# Patient Record
Sex: Female | Born: 1970 | Race: Black or African American | Hispanic: No | Marital: Married | Smoking: Never smoker
Health system: Southern US, Community
[De-identification: ages and names within clinical notes are randomized; demographics above are authoritative.]

## PROBLEM LIST (undated history)

## (undated) DIAGNOSIS — M1990 Unspecified osteoarthritis, unspecified site: Secondary | ICD-10-CM

## (undated) DIAGNOSIS — N920 Excessive and frequent menstruation with regular cycle: Secondary | ICD-10-CM

## (undated) DIAGNOSIS — N946 Dysmenorrhea, unspecified: Secondary | ICD-10-CM

## (undated) DIAGNOSIS — N83209 Unspecified ovarian cyst, unspecified side: Secondary | ICD-10-CM

## (undated) HISTORY — DX: Excessive and frequent menstruation with regular cycle: N92.0

## (undated) HISTORY — DX: Dysmenorrhea, unspecified: N94.6

## (undated) HISTORY — DX: Unspecified ovarian cyst, unspecified side: N83.209

---

## 1997-12-11 ENCOUNTER — Encounter: Admission: RE | Admit: 1997-12-11 | Discharge: 1998-03-11 | Payer: Self-pay | Admitting: Family Medicine

## 1998-12-30 ENCOUNTER — Emergency Department (HOSPITAL_COMMUNITY): Admission: EM | Admit: 1998-12-30 | Discharge: 1998-12-30 | Payer: Self-pay

## 1999-04-25 ENCOUNTER — Emergency Department (HOSPITAL_COMMUNITY): Admission: EM | Admit: 1999-04-25 | Discharge: 1999-04-25 | Payer: Self-pay | Admitting: Emergency Medicine

## 1999-04-28 ENCOUNTER — Emergency Department (HOSPITAL_COMMUNITY): Admission: EM | Admit: 1999-04-28 | Discharge: 1999-04-28 | Payer: Self-pay | Admitting: Emergency Medicine

## 1999-10-15 ENCOUNTER — Emergency Department (HOSPITAL_COMMUNITY): Admission: EM | Admit: 1999-10-15 | Discharge: 1999-10-15 | Payer: Self-pay

## 2000-02-12 ENCOUNTER — Emergency Department (HOSPITAL_COMMUNITY): Admission: EM | Admit: 2000-02-12 | Discharge: 2000-02-12 | Payer: Self-pay

## 2001-04-18 ENCOUNTER — Encounter: Payer: Self-pay | Admitting: Emergency Medicine

## 2001-04-18 ENCOUNTER — Emergency Department (HOSPITAL_COMMUNITY): Admission: EM | Admit: 2001-04-18 | Discharge: 2001-04-18 | Payer: Self-pay | Admitting: Emergency Medicine

## 2002-07-05 ENCOUNTER — Emergency Department (HOSPITAL_COMMUNITY): Admission: EM | Admit: 2002-07-05 | Discharge: 2002-07-05 | Payer: Self-pay | Admitting: Emergency Medicine

## 2002-07-05 ENCOUNTER — Encounter: Payer: Self-pay | Admitting: Emergency Medicine

## 2002-07-06 ENCOUNTER — Emergency Department (HOSPITAL_COMMUNITY): Admission: EM | Admit: 2002-07-06 | Discharge: 2002-07-06 | Payer: Self-pay | Admitting: Emergency Medicine

## 2003-07-04 ENCOUNTER — Emergency Department (HOSPITAL_COMMUNITY): Admission: AD | Admit: 2003-07-04 | Discharge: 2003-07-04 | Payer: Self-pay | Admitting: Family Medicine

## 2004-09-08 ENCOUNTER — Ambulatory Visit: Payer: Self-pay | Admitting: Internal Medicine

## 2004-12-26 ENCOUNTER — Ambulatory Visit: Payer: Self-pay | Admitting: Internal Medicine

## 2005-08-13 ENCOUNTER — Emergency Department (HOSPITAL_COMMUNITY): Admission: EM | Admit: 2005-08-13 | Discharge: 2005-08-13 | Payer: Self-pay | Admitting: Family Medicine

## 2006-04-29 ENCOUNTER — Ambulatory Visit: Payer: Self-pay | Admitting: Internal Medicine

## 2006-04-29 LAB — CONVERTED CEMR LAB
ALT: 19 units/L (ref 0–40)
AST: 15 units/L (ref 0–37)
Albumin: 3.9 g/dL (ref 3.5–5.2)
Alkaline Phosphatase: 56 units/L (ref 39–117)
Anti Nuclear Antibody(ANA): NEGATIVE
BUN: 7 mg/dL (ref 6–23)
Basophils Absolute: 0.1 10*3/uL (ref 0.0–0.1)
Basophils Relative: 0.8 % (ref 0.0–1.0)
Bilirubin Urine: NEGATIVE
Bilirubin, Direct: 0.1 mg/dL (ref 0.0–0.3)
CO2: 27 meq/L (ref 19–32)
Calcium: 9.5 mg/dL (ref 8.4–10.5)
Chloride: 104 meq/L (ref 96–112)
Cholesterol: 184 mg/dL (ref 0–200)
Creatinine, Ser: 0.8 mg/dL (ref 0.4–1.2)
Crystals: NEGATIVE
Eosinophils Absolute: 0.1 10*3/uL (ref 0.0–0.6)
Eosinophils Relative: 1.4 % (ref 0.0–5.0)
GFR calc Af Amer: 106 mL/min
GFR calc non Af Amer: 88 mL/min
Glucose, Bld: 123 mg/dL — ABNORMAL HIGH (ref 70–99)
HCT: 39.6 % (ref 36.0–46.0)
HDL: 28.2 mg/dL — ABNORMAL LOW (ref >39.0)
Hemoglobin, Urine: NEGATIVE
Hemoglobin: 13.7 g/dL (ref 12.0–15.0)
Hgb A1c MFr Bld: 7.6 % — ABNORMAL HIGH (ref 4.6–6.0)
LDL Cholesterol: 124 mg/dL — ABNORMAL HIGH (ref 0–99)
Lymphocytes Relative: 44 % (ref 12.0–46.0)
MCHC: 34.7 g/dL (ref 30.0–36.0)
MCV: 86.6 fL (ref 78.0–100.0)
Monocytes Absolute: 0.5 10*3/uL (ref 0.2–0.7)
Monocytes Relative: 7.3 % (ref 3.0–11.0)
Neutro Abs: 3 10*3/uL (ref 1.4–7.7)
Neutrophils Relative %: 46.5 % (ref 43.0–77.0)
Nitrite: NEGATIVE
Platelets: 272 10*3/uL (ref 150–400)
Potassium: 4.1 meq/L (ref 3.5–5.1)
RBC: 4.57 M/uL (ref 3.87–5.11)
RDW: 13.6 % (ref 11.5–14.6)
Rheumatoid fact SerPl-aCnc: <20 intl units/mL — ABNORMAL LOW (ref 0.0–20.0)
Sed Rate: 30 mm/hr — ABNORMAL HIGH (ref 0–25)
Sodium: 139 meq/L (ref 135–145)
Specific Gravity, Urine: >=1.03 (ref 1.000–1.03)
TSH: 0.79 microintl units/mL (ref 0.35–5.50)
Total Bilirubin: 0.7 mg/dL (ref 0.3–1.2)
Total CHOL/HDL Ratio: 6.5
Total Protein, Urine: NEGATIVE mg/dL
Total Protein: 7.4 g/dL (ref 6.0–8.3)
Triglycerides: 160 mg/dL — ABNORMAL HIGH (ref 0–149)
Urine Glucose: NEGATIVE mg/dL
Urobilinogen, UA: 0.2 (ref 0.0–1.0)
VLDL: 32 mg/dL (ref 0–40)
WBC: 6.6 10*3/uL (ref 4.5–10.5)
pH: 6 (ref 5.0–8.0)

## 2007-03-21 ENCOUNTER — Ambulatory Visit: Payer: Self-pay | Admitting: Internal Medicine

## 2007-03-21 DIAGNOSIS — J309 Allergic rhinitis, unspecified: Secondary | ICD-10-CM | POA: Insufficient documentation

## 2007-03-21 DIAGNOSIS — E785 Hyperlipidemia, unspecified: Secondary | ICD-10-CM | POA: Insufficient documentation

## 2007-03-21 DIAGNOSIS — J45909 Unspecified asthma, uncomplicated: Secondary | ICD-10-CM | POA: Insufficient documentation

## 2007-03-21 DIAGNOSIS — E119 Type 2 diabetes mellitus without complications: Secondary | ICD-10-CM | POA: Insufficient documentation

## 2007-03-21 DIAGNOSIS — E739 Lactose intolerance, unspecified: Secondary | ICD-10-CM | POA: Insufficient documentation

## 2007-03-21 DIAGNOSIS — R350 Frequency of micturition: Secondary | ICD-10-CM | POA: Insufficient documentation

## 2007-06-16 ENCOUNTER — Telehealth (INDEPENDENT_AMBULATORY_CARE_PROVIDER_SITE_OTHER): Payer: Self-pay | Admitting: *Deleted

## 2007-06-17 ENCOUNTER — Ambulatory Visit: Payer: Self-pay | Admitting: Internal Medicine

## 2007-06-21 ENCOUNTER — Encounter: Payer: Self-pay | Admitting: Internal Medicine

## 2011-07-14 ENCOUNTER — Ambulatory Visit (INDEPENDENT_AMBULATORY_CARE_PROVIDER_SITE_OTHER): Payer: BC Managed Care – HMO | Admitting: Obstetrics and Gynecology

## 2011-07-14 ENCOUNTER — Encounter: Payer: Self-pay | Admitting: Obstetrics and Gynecology

## 2011-07-14 VITALS — BP 116/74 | HR 78 | Temp 98.9°F | Wt 183.0 lb

## 2011-07-14 DIAGNOSIS — N750 Cyst of Bartholin's gland: Secondary | ICD-10-CM | POA: Insufficient documentation

## 2011-07-14 DIAGNOSIS — N949 Unspecified condition associated with female genital organs and menstrual cycle: Secondary | ICD-10-CM

## 2011-07-14 DIAGNOSIS — R102 Pelvic and perineal pain: Secondary | ICD-10-CM

## 2011-07-14 DIAGNOSIS — Z975 Presence of (intrauterine) contraceptive device: Secondary | ICD-10-CM

## 2011-07-14 LAB — POCT URINE PREGNANCY: Preg Test, Ur: NEGATIVE

## 2011-07-14 NOTE — Patient Instructions (Signed)
Keep appointment for pelvic ultrasound as scheduled.  If you cannot, please call and reschedule.

## 2011-07-14 NOTE — Progress Notes (Signed)
Patient with Mirena insertion 12/23/10 (pt. didn't come for f/u), c/o vaginal swelling and pelvic pain (random x 2 days a few times a month x 2 months).  Pain is not severe and often noticeable but easily ignored.  Vaginal swelling occurred 1 week ago, without pain and only on right side. Pt. noticed when she was bathing. Denies vaginitis sx, dyspareunia,  change in BMs or uti sx.  Admits to transient lower back pain  PMH: + ovarian cysts  O: Back: no CVA tenderness, Abdomen: soft, non-tender     Pelvic: EGBUS- 2 cm non-tender right Bartholin's Cyst,      vagina-rugous, cervix-string visible, no lesions, uterus- ULNS, irregular, non-tender  UPT: negative  A: Pelvic Pain-random     IUD F/U     Right Bartholin's Cyst  P: pelvic ultrasound r/o pelvic masses     reviewed Bartholin's Cyst     f/u after ultrasound    reviewed causes of pelvic pain

## 2011-12-17 ENCOUNTER — Encounter: Payer: Self-pay | Admitting: Obstetrics and Gynecology

## 2011-12-17 ENCOUNTER — Ambulatory Visit (INDEPENDENT_AMBULATORY_CARE_PROVIDER_SITE_OTHER): Payer: BC Managed Care – HMO | Admitting: Obstetrics and Gynecology

## 2011-12-17 VITALS — BP 120/78 | Temp 98.7°F | Wt 186.0 lb

## 2011-12-17 DIAGNOSIS — B9689 Other specified bacterial agents as the cause of diseases classified elsewhere: Secondary | ICD-10-CM

## 2011-12-17 DIAGNOSIS — Z975 Presence of (intrauterine) contraceptive device: Secondary | ICD-10-CM

## 2011-12-17 DIAGNOSIS — A499 Bacterial infection, unspecified: Secondary | ICD-10-CM

## 2011-12-17 DIAGNOSIS — R82998 Other abnormal findings in urine: Secondary | ICD-10-CM

## 2011-12-17 DIAGNOSIS — N72 Inflammatory disease of cervix uteri: Secondary | ICD-10-CM

## 2011-12-17 DIAGNOSIS — N760 Acute vaginitis: Secondary | ICD-10-CM

## 2011-12-17 DIAGNOSIS — R8281 Pyuria: Secondary | ICD-10-CM

## 2011-12-17 DIAGNOSIS — N949 Unspecified condition associated with female genital organs and menstrual cycle: Secondary | ICD-10-CM

## 2011-12-17 DIAGNOSIS — N898 Other specified noninflammatory disorders of vagina: Secondary | ICD-10-CM

## 2011-12-17 DIAGNOSIS — R102 Pelvic and perineal pain: Secondary | ICD-10-CM

## 2011-12-17 LAB — POCT URINALYSIS DIPSTICK
Protein, UA: NEGATIVE
Urobilinogen, UA: NEGATIVE
pH, UA: 6

## 2011-12-17 LAB — POCT WET PREP (WET MOUNT)
Whiff Test: POSITIVE
pH: 5.5

## 2011-12-17 LAB — POCT URINE PREGNANCY: Preg Test, Ur: NEGATIVE

## 2011-12-17 MED ORDER — HYDROCODONE-ACETAMINOPHEN 5-300 MG PO TABS: 1.0000 | ORAL_TABLET | ORAL | Status: DC

## 2011-12-17 MED ORDER — METRONIDAZOLE 500 MG PO TABS: ORAL_TABLET | ORAL | Status: DC

## 2011-12-17 MED ORDER — FLUCONAZOLE 150 MG PO TABS: 150.0000 mg | ORAL_TABLET | Freq: Once | ORAL | Status: DC

## 2011-12-17 NOTE — Progress Notes (Signed)
41 YO with a Mirena IUD (inserted 12/23/11) and a history of left lower quadrant pelvic pain. Pain is off & on, sharp at times and crampy at others.  May last a hour at a time and can't associate it with anything.  Has taken Ibuprofen 400mg   which will decrease a 9/10 pain to 3/10.  Denies urinary tract/bowel symptoms, dyspareunia (but last few times she has bled for a few days afterwards) nor back pain.  O: Abdomen: soft, non-tender      Pelvic: EGBUS-wnl, vagina-small blood, cervix-string visible with small raw area-inferior medial, uterus-tender, adnexae- tenderness in left adnexae   U/A:  pH-6.0, SG-1.020, leukocytes-1+, blood-2+ UPT-negative  A: Pelvic Pain     Bacterial Vaginosis     Mirena IUD     History of ovarian cyst  P: Metronidazole 500 mg #14 bid x 7 days no refills       Pelvic U/S-rule out pelvic masses      Vicodin 5/500 1 po q 6 hours prn moderate-severe pain #20       Diflucan 150 mg #1 1 po stat 1 refill       RTO-for ultrasound  Mase Dhondt, PA-C

## 2011-12-17 NOTE — Progress Notes (Signed)
Contraception: IUD History of STD:  history of chlamydia History of ovarian cyst: yes:  Long time History of fibroids: no History of endometriosis:no Previous ultrasound:no  Urinary symptoms: none Gastro-intestinal symptoms:  Constipation: yes     Diarrhea: no     Nausea: no     Vomiting: no     Fever: no Vaginal discharge: no vaginal discharge  Pt states abdominal pain is in lower left quandrant.

## 2011-12-21 ENCOUNTER — Encounter: Payer: Self-pay | Admitting: Obstetrics and Gynecology

## 2011-12-21 ENCOUNTER — Ambulatory Visit (INDEPENDENT_AMBULATORY_CARE_PROVIDER_SITE_OTHER): Payer: BC Managed Care – HMO

## 2011-12-21 ENCOUNTER — Ambulatory Visit (INDEPENDENT_AMBULATORY_CARE_PROVIDER_SITE_OTHER): Payer: BC Managed Care – HMO | Admitting: Obstetrics and Gynecology

## 2011-12-21 VITALS — Wt 186.0 lb

## 2011-12-21 DIAGNOSIS — N930 Postcoital and contact bleeding: Secondary | ICD-10-CM

## 2011-12-21 DIAGNOSIS — D219 Benign neoplasm of connective and other soft tissue, unspecified: Secondary | ICD-10-CM

## 2011-12-21 DIAGNOSIS — R102 Pelvic and perineal pain: Secondary | ICD-10-CM

## 2011-12-21 DIAGNOSIS — N949 Unspecified condition associated with female genital organs and menstrual cycle: Secondary | ICD-10-CM

## 2011-12-21 DIAGNOSIS — D259 Leiomyoma of uterus, unspecified: Secondary | ICD-10-CM

## 2011-12-21 NOTE — Progress Notes (Signed)
41 YO seen last week for pelvic pain returns for ultrasound.  O: U/S:uterus-8.46 x 7.75 x 6.67 cm, endometrium-0.261 cm, IUD in correct position within the endometrial cavity pe 3D rendering      ovaries appeared normal with no free fluid in pelvis or adnexal abnormalities.  A: Pelvic pain     Fibroid (<1.5 cm)     S/P Cryotherapy of cervix 22 years ago     H/O Post Coital Bleeding Episodes  P: Reviewed fibroids & Reviewed causes of pelvic pain: urogenital, previous surgery, gastrointestinal and musculoskeletal.     F/U with PCP for non-gyn pelvic pain;  aware laparoscopy is a last resort but the next in evaluation of gyn causes of pelvic pain       Reviewed causes of post-coital bleeding to include: infection, endometrial polyps and cervical abnormalities      Will consult Dr. Pennie Rushing about follow up for post-coital bleeding       RTO-as scheduled or prn

## 2011-12-21 NOTE — Patient Instructions (Signed)
Fibroids You have been diagnosed as having a fibroid. Fibroids are smooth muscle lumps (tumors) which can occur any place in a woman's body. They are usually in the womb (uterus). The most common problem (symptom) of fibroids is bleeding. Over time this may cause low red blood cells (anemia). Other symptoms include feelings of pressure and pain in the pelvis. The diagnosis (learning what is wrong) of fibroids is made by physical exam. Sometimes tests such as an ultrasound are used. This is helpful when fibroids are felt around the ovaries and to look for tumors. TREATMENT   Most fibroids do not need surgical or medical treatment. Sometimes a tissue sample (biopsy) of the lining of the uterus is done to rule out cancer. If there is no cancer and only a small amount of bleeding, the problem can be watched.   Hormonal treatment can improve the problem.   When surgery is needed, it can consist of removing the fibroid. Vaginal birth may not be possible after the removal of fibroids. This depends on where they are and the extent of surgery. When pregnancy occurs with fibroids it is usually normal.   Your caregiver can help decide which treatments are best for you.  HOME CARE INSTRUCTIONS   Do not use aspirin as this may increase bleeding problems.   If your periods (menses) are heavy, record the number of pads or tampons used per month. Bring this information to your caregiver. This can help them determine the best treatment for you.  SEEK IMMEDIATE MEDICAL CARE IF:  You have pelvic pain or cramps not controlled with medications, or experience a sudden increase in pain.   You have an increase of pelvic bleeding between and during menses.   You feel lightheaded or have fainting spells.   You develop worsening belly (abdominal) pain.  Document Released: 03/13/2000 Document Revised: 03/05/2011 Document Reviewed: 11/03/2007 [employer] Patient Information 2012 [employer].    

## 2011-12-25 ENCOUNTER — Telehealth: Payer: Self-pay | Admitting: Obstetrics and Gynecology

## 2011-12-25 NOTE — Telephone Encounter (Signed)
Left message that I had spoken with Dr. Pennie Rushing about her concern at the last visit and that I'd call back to discuss.  Japhet Morgenthaler, PA-C

## 2011-12-28 ENCOUNTER — Other Ambulatory Visit: Payer: BC Managed Care – HMO

## 2011-12-28 ENCOUNTER — Encounter: Payer: BC Managed Care – HMO | Admitting: Obstetrics and Gynecology

## 2011-12-28 ENCOUNTER — Telehealth: Payer: Self-pay | Admitting: Obstetrics and Gynecology

## 2011-12-28 MED ORDER — DOXYCYCLINE HYCLATE 100 MG PO CAPS: 100.0000 mg | ORAL_CAPSULE | Freq: Two times a day (BID) | ORAL | Status: DC

## 2011-12-28 NOTE — Telephone Encounter (Signed)
Return call to patient with post-coital bleeding and IUD to advise that Dr. Pennie Rushing recommended testing for GC/CT and Doxycycline 100 mg bid x 7 days.  Afterward, if symptoms persists, she will need follow up in 6 weeks.  Patient was agreeable.  Doxycycline e-prescribed and patient advised to call to set up an appointment for GC/CT testing.  Commodore Bellew, PA-C

## 2013-11-10 ENCOUNTER — Encounter (HOSPITAL_COMMUNITY): Payer: Self-pay | Admitting: Emergency Medicine

## 2013-11-10 ENCOUNTER — Emergency Department (HOSPITAL_COMMUNITY)
Admission: EM | Admit: 2013-11-10 | Discharge: 2013-11-10 | Disposition: A | Discharge status: Home / Self Care | Payer: BC Managed Care – PPO | Attending: Emergency Medicine | Admitting: Emergency Medicine

## 2013-11-10 DIAGNOSIS — Z8742 Personal history of other diseases of the female genital tract: Secondary | ICD-10-CM | POA: Insufficient documentation

## 2013-11-10 DIAGNOSIS — L500 Allergic urticaria: Secondary | ICD-10-CM | POA: Diagnosis not present

## 2013-11-10 DIAGNOSIS — J45909 Unspecified asthma, uncomplicated: Secondary | ICD-10-CM | POA: Diagnosis not present

## 2013-11-10 DIAGNOSIS — Z79899 Other long term (current) drug therapy: Secondary | ICD-10-CM | POA: Diagnosis not present

## 2013-11-10 DIAGNOSIS — E119 Type 2 diabetes mellitus without complications: Secondary | ICD-10-CM | POA: Diagnosis not present

## 2013-11-10 DIAGNOSIS — Z792 Long term (current) use of antibiotics: Secondary | ICD-10-CM | POA: Insufficient documentation

## 2013-11-10 DIAGNOSIS — R21 Rash and other nonspecific skin eruption: Secondary | ICD-10-CM | POA: Insufficient documentation

## 2013-11-10 DIAGNOSIS — T4995XA Adverse effect of unspecified topical agent, initial encounter: Secondary | ICD-10-CM | POA: Diagnosis not present

## 2013-11-10 DIAGNOSIS — T7840XA Allergy, unspecified, initial encounter: Secondary | ICD-10-CM

## 2013-11-10 MED ORDER — FAMOTIDINE IN NACL 20-0.9 MG/50ML-% IV SOLN
20.0000 mg | Freq: Once | INTRAVENOUS | Status: AC
  Administered 2013-11-10: 20 mg via INTRAVENOUS
  Filled 2013-11-10: qty 50

## 2013-11-10 MED ORDER — DIPHENHYDRAMINE HCL 50 MG/ML IJ SOLN
50.0000 mg | Freq: Once | INTRAMUSCULAR | Status: AC
  Administered 2013-11-10: 50 mg via INTRAVENOUS
  Filled 2013-11-10: qty 1

## 2013-11-10 MED ORDER — PREDNISONE 20 MG PO TABS: ORAL_TABLET | ORAL | Status: DC

## 2013-11-10 MED ORDER — ONDANSETRON HCL 4 MG/2ML IJ SOLN
4.0000 mg | Freq: Once | INTRAMUSCULAR | Status: AC
  Administered 2013-11-10: 4 mg via INTRAVENOUS
  Filled 2013-11-10: qty 2

## 2013-11-10 MED ORDER — METHYLPREDNISOLONE SODIUM SUCC 125 MG IJ SOLR
125.0000 mg | Freq: Once | INTRAMUSCULAR | Status: AC
  Administered 2013-11-10: 125 mg via INTRAVENOUS
  Filled 2013-11-10: qty 2

## 2013-11-10 MED ORDER — IBUPROFEN 400 MG PO TABS
600.0000 mg | ORAL_TABLET | Freq: Once | ORAL | Status: AC
  Administered 2013-11-10: 600 mg via ORAL
  Filled 2013-11-10 (×2): qty 1

## 2013-11-10 NOTE — Discharge Instructions (Signed)
Use Benadryl as directed if you should have further itching. Return here for any problems Allergies Allergies may happen from anything your body is sensitive to. This may be food, medicines, pollens, chemicals, and nearly anything around you in everyday life that produces allergens. An allergen is anything that causes an allergy producing substance. Heredity is often a factor in causing these problems. This means you may have some of the same allergies as your parents. Food allergies happen in all age groups. Food allergies are some of the most severe and life threatening. Some common food allergies are cow's milk, seafood, eggs, nuts, wheat, and soybeans. SYMPTOMS   Swelling around the mouth.  An itchy red rash or hives.  Vomiting or diarrhea.  Difficulty breathing. SEVERE ALLERGIC REACTIONS ARE LIFE-THREATENING. This reaction is called anaphylaxis. It can cause the mouth and throat to swell and cause difficulty with breathing and swallowing. In severe reactions only a trace amount of food (for example, peanut oil in a salad) may cause death within seconds. Seasonal allergies occur in all age groups. These are seasonal because they usually occur during the same season every year. They may be a reaction to molds, grass pollens, or tree pollens. Other causes of problems are house dust mite allergens, pet dander, and mold spores. The symptoms often consist of nasal congestion, a runny itchy nose associated with sneezing, and tearing itchy eyes. There is often an associated itching of the mouth and ears. The problems happen when you come in contact with pollens and other allergens. Allergens are the particles in the air that the body reacts to with an allergic reaction. This causes you to release allergic antibodies. Through a chain of events, these eventually cause you to release histamine into the blood stream. Although it is meant to be protective to the body, it is this release that causes your  discomfort. This is why you were given anti-histamines to feel better. If you are unable to pinpoint the offending allergen, it may be determined by skin or blood testing. Allergies cannot be cured but can be controlled with medicine. Hay fever is a collection of all or some of the seasonal allergy problems. It may often be treated with simple over-the-counter medicine such as diphenhydramine. Take medicine as directed. Do not drink alcohol or drive while taking this medicine. Check with your caregiver or package insert for child dosages. If these medicines are not effective, there are many new medicines your caregiver can prescribe. Stronger medicine such as nasal spray, eye drops, and corticosteroids may be used if the first things you try do not work well. Other treatments such as immunotherapy or desensitizing injections can be used if all else fails. Follow up with your caregiver if problems continue. These seasonal allergies are usually not life threatening. They are generally more of a nuisance that can often be handled using medicine. HOME CARE INSTRUCTIONS   If unsure what causes a reaction, keep a diary of foods eaten and symptoms that follow. Avoid foods that cause reactions.  If hives or rash are present:  Take medicine as directed.  You may use an over-the-counter antihistamine (diphenhydramine) for hives and itching as needed.  Apply cold compresses (cloths) to the skin or take baths in cool water. Avoid hot baths or showers. Heat will make a rash and itching worse.  If you are severely allergic:  Following a treatment for a severe reaction, hospitalization is often required for closer follow-up.  Wear a medic-alert bracelet or necklace stating  the allergy.  You and your family must learn how to give adrenaline or use an anaphylaxis kit.  If you have had a severe reaction, always carry your anaphylaxis kit or EpiPen with you. Use this medicine as directed by your caregiver if a  severe reaction is occurring. Failure to do so could have a fatal outcome. SEEK MEDICAL CARE IF:  You suspect a food allergy. Symptoms generally happen within 30 minutes of eating a food.  Your symptoms have not gone away within 2 days or are getting worse.  You develop new symptoms.  You want to retest yourself or your child with a food or drink you think causes an allergic reaction. Never do this if an anaphylactic reaction to that food or drink has happened before. Only do this under the care of a caregiver. SEEK IMMEDIATE MEDICAL CARE IF:   You have difficulty breathing, are wheezing, or have a tight feeling in your chest or throat.  You have a swollen mouth, or you have hives, swelling, or itching all over your body.  You have had a severe reaction that has responded to your anaphylaxis kit or an EpiPen. These reactions may return when the medicine has worn off. These reactions should be considered life threatening. MAKE SURE YOU:   Understand these instructions.  Will watch your condition.  Will get help right away if you are not doing well or get worse. Document Released: 06/09/2002 Document Revised: 07/11/2012 Document Reviewed: 11/14/2007 [facility] Patient Information 2015 [place], [place]. This information is not intended to replace advice given to you by your health care provider. Make sure you discuss any questions you have with your health care provider.

## 2013-11-10 NOTE — ED Notes (Signed)
Pt reports had some swelling to R eye yesterday and woke up this morning with increased swelling; pt c/o throat swelling; swelling is noted to back of throat; denies difficulty breathing; came from MDs office and received steroid; pt also with rash to face; pt does not have any allergies

## 2013-11-10 NOTE — ED Notes (Signed)
Pt c/o period cramps and nausea.

## 2013-11-10 NOTE — ED Provider Notes (Signed)
CSN: [id number]     Arrival date & time 11/10/13  1218 History   First MD Initiated Contact with Patient 11/10/13 1230     Chief Complaint  Patient presents with  . Allergic Reaction     (Consider location/radiation/quality/duration/timing/severity/associated sxs/prior Treatment) HPI Comments:   Patient here complaining of diffuse body rash or pruritus. Symptoms began at her right eye and progressed. Denies any chemical exposures. Went to her doctor's office and was given steroids and sent here for further evaluation. She did notice some scratchiness to the back of her throat but denies any trouble swallowing. Symptoms have gradually been improving. No prior history of same. Denies any medication use.  Patient is a 43 y.o. female presenting with allergic reaction. The history is provided by the patient.  Allergic Reaction   Past Medical History  Diagnosis Date  . Diabetes mellitus   . Asthma   . Menorrhagia   . Dysmenorrhea   . Ovarian cyst    History reviewed. No pertinent past surgical history. Family History  Problem Relation Age of Onset  . Fibromyalgia Mother   . Hypertension Mother   . Depression Mother   . Cancer Father     prostate  . Hypertension Sister   . Hypertension Maternal Grandmother   . Stroke Maternal Grandmother   . Hypertension Maternal Grandfather   . Diabetes Paternal Grandmother    History  Substance Use Topics  . Smoking status: Never Smoker   . Smokeless tobacco: Never Used  . Alcohol Use: No   OB History   Grav Para Term Preterm Abortions TAB SAB Ect Mult Living   1 1 1       1      Review of Systems  All other systems reviewed and are negative.     Allergies  Levofloxacin  Home Medications   Prior to Admission medications   Medication Sig Start Date End Date Taking? Authorizing Provider  doxycycline (VIBRAMYCIN) 100 MG capsule Take 1 capsule (100 mg total) by mouth 2 (two) times daily. 12/28/11   Elmira Powell, PA-C  fexofenadine  (ALLEGRA) 30 MG tablet Take 30 mg by mouth 2 (two) times daily.    Historical Provider, MD  fluconazole (DIFLUCAN) 150 MG tablet Take 1 tablet (150 mg total) by mouth once. 12/17/11   Earnstine Regal, PA-C  Hydrocodone-Acetaminophen 5-300 MG TABS Take 1 tablet by mouth every 4 (four) hours. 12/17/11   Earnstine Regal, PA-C  Liraglutide (VICTOZA [place]) Inject into the skin.    Historical Provider, MD  metFORMIN (GLUCOPHAGE) 500 MG tablet Take 500 mg by mouth 2 (two) times daily with a meal.    Historical Provider, MD  metroNIDAZOLE (FLAGYL) 500 MG tablet 1 po bid x 7 days 12/17/11   Earnstine Regal, PA-C  Multiple Vitamin (MULTIVITAMIN) tablet Take 1 tablet by mouth daily.    Historical Provider, MD   BP 135/74  Pulse 94  Temp(Src) 98.3 F (36.8 C) (Oral)  Resp 20  Ht 5\' 3"  (1.6 m)  SpO2 100% Physical Exam  Nursing note and vitals reviewed. Constitutional: She is oriented to person, place, and time. She appears well-developed and well-nourished.  Non-toxic appearance. No distress.  HENT:  Head: Normocephalic and atraumatic.  Eyes: Conjunctivae, EOM and lids are normal. Pupils are equal, round, and reactive to light.  Neck: Normal range of motion. Neck supple. No tracheal deviation present. No mass present.  Cardiovascular: Normal rate, regular rhythm and normal heart sounds.  Exam reveals no gallop.  No murmur heard. Pulmonary/Chest: Effort normal and breath sounds normal. No stridor. No respiratory distress. She has no decreased breath sounds. She has no wheezes. She has no rhonchi. She has no rales.  Abdominal: Soft. Normal appearance and bowel sounds are normal. She exhibits no distension. There is no tenderness. There is no rebound and no CVA tenderness.  Musculoskeletal: Normal range of motion. She exhibits no edema and no tenderness.  Neurological: She is alert and oriented to person, place, and time. She has normal strength. No cranial nerve deficit or sensory deficit. GCS eye subscore is 4.  GCS verbal subscore is 5. GCS motor subscore is 6.  Skin: Skin is warm and dry. Rash noted. No abrasion noted. Rash is urticarial.  Psychiatric: She has a normal mood and affect. Her speech is normal and behavior is normal.    ED Course  Procedures (including critical care time) Labs Review Labs Reviewed - No data to display  Imaging Review No results found.   EKG Interpretation None      MDM   Final diagnoses:  None    Patient given medications here for allergic reaction. Has greatly improved. Patient will be given referral to allergist    Leota Jacobsen, MD 11/10/13 [phone]

## 2013-11-13 ENCOUNTER — Emergency Department (HOSPITAL_COMMUNITY)
Admission: EM | Admit: 2013-11-13 | Discharge: 2013-11-13 | Disposition: A | Discharge status: Home / Self Care | Payer: BC Managed Care – PPO | Attending: Emergency Medicine | Admitting: Emergency Medicine

## 2013-11-13 ENCOUNTER — Encounter (HOSPITAL_COMMUNITY): Payer: Self-pay | Admitting: Emergency Medicine

## 2013-11-13 ENCOUNTER — Emergency Department (HOSPITAL_COMMUNITY): Payer: BC Managed Care – PPO

## 2013-11-13 DIAGNOSIS — J029 Acute pharyngitis, unspecified: Secondary | ICD-10-CM | POA: Insufficient documentation

## 2013-11-13 DIAGNOSIS — Z9109 Other allergy status, other than to drugs and biological substances: Secondary | ICD-10-CM

## 2013-11-13 DIAGNOSIS — Z79899 Other long term (current) drug therapy: Secondary | ICD-10-CM | POA: Insufficient documentation

## 2013-11-13 DIAGNOSIS — IMO0002 Reserved for concepts with insufficient information to code with codable children: Secondary | ICD-10-CM | POA: Insufficient documentation

## 2013-11-13 DIAGNOSIS — J309 Allergic rhinitis, unspecified: Secondary | ICD-10-CM | POA: Insufficient documentation

## 2013-11-13 DIAGNOSIS — J45909 Unspecified asthma, uncomplicated: Secondary | ICD-10-CM | POA: Diagnosis not present

## 2013-11-13 DIAGNOSIS — E119 Type 2 diabetes mellitus without complications: Secondary | ICD-10-CM | POA: Insufficient documentation

## 2013-11-13 DIAGNOSIS — Z8742 Personal history of other diseases of the female genital tract: Secondary | ICD-10-CM | POA: Diagnosis not present

## 2013-11-13 MED ORDER — AZELASTINE HCL 0.1 % NA SOLN: 1.0000 | Freq: Two times a day (BID) | NASAL | Status: DC

## 2013-11-13 MED ORDER — OXYMETAZOLINE HCL 0.05 % NA SOLN
1.0000 | Freq: Once | NASAL | Status: AC
  Administered 2013-11-13: 1 via NASAL
  Filled 2013-11-13: qty 15

## 2013-11-13 NOTE — ED Notes (Addendum)
Feels like she has mucus in throat today  Feels like she cannot breath at times  Or take a deep breath t was seen Friday for same  States it is not going away was tpols she might have allergic reaction . Pt handling her secretions well speech is clear pulse ox 100 % ra

## 2013-11-13 NOTE — ED Provider Notes (Signed)
CSN: [id number]     Arrival date & time 11/13/13  1342 History   None    Chief Complaint  Patient presents with  . Sore Throat  . Shortness of Breath     (Consider location/radiation/quality/duration/timing/severity/associated sxs/prior Treatment) HPI  Danielle Chang is a 43 y.o. female with past medical history of diabetes, allergies, and asthma presenting for sore throat, and shortness of breath. Patient was recently evaluated for allergic reaction, and started on steroids was scheduled Benadryl. Patient states her symptoms have been stable however she continues to have some throat irritation, which makes her feel like it is "harder to breathe", as well some associated sore throat. Patient states that she's been able to tolerate liquids, but has some irritation when trying to swallow solids. She had difficulty describing her sensation but states that she feels on odd feeling in her throat when she tries to swallow. Patient has not had any nausea or vomiting. Denies chest pain. States the shortness of breath is more of a "constricted" quality in her throat, rather than a sensation in her chest, or lungs. Patient states she works in a [employer] dust a regular basis. Patient denies any fevers, chills, cough, abdominal pain, chest pain, or other associated symptoms.   Past Medical History  Diagnosis Date  . Diabetes mellitus   . Asthma   . Menorrhagia   . Dysmenorrhea   . Ovarian cyst    History reviewed. No pertinent past surgical history. Family History  Problem Relation Age of Onset  . Fibromyalgia Mother   . Hypertension Mother   . Depression Mother   . Cancer Father     prostate  . Hypertension Sister   . Hypertension Maternal Grandmother   . Stroke Maternal Grandmother   . Hypertension Maternal Grandfather   . Diabetes Paternal Grandmother    History  Substance Use Topics  . Smoking status: Never Smoker   . Smokeless tobacco: Never Used  . Alcohol Use: No    OB History   Grav Para Term Preterm Abortions TAB SAB Ect Mult Living   1 1 1       1      Review of Systems  Constitutional: Negative.   HENT: Positive for sore throat.   Eyes: Negative.   Respiratory: Positive for shortness of breath.   Cardiovascular: Negative.   Gastrointestinal: Negative.   Endocrine: Negative.   Genitourinary: Negative.   Musculoskeletal: Negative.   Skin: Negative.   Allergic/Immunologic: Negative.   Neurological: Negative.   Hematological: Negative.   Psychiatric/Behavioral: Negative.       Allergies  Levofloxacin  Home Medications   Prior to Admission medications   Medication Sig Start Date End Date Taking? Authorizing Provider  Exenatide ER (BYDUREON) 2 MG PEN Inject 2 mg into the skin once a week.   Yes Historical Provider, MD  ibuprofen (ADVIL,MOTRIN) 200 MG tablet Take 400 mg by mouth every 6 (six) hours as needed for headache or moderate pain.   Yes Historical Provider, MD  metFORMIN (GLUCOPHAGE) 500 MG tablet Take 500 mg by mouth daily.    Yes Historical Provider, MD  Multiple Vitamin (MULTIVITAMIN) tablet Take 1 tablet by mouth daily.   Yes Historical Provider, MD  predniSONE (DELTASONE) 20 MG tablet Take 40 mg by mouth daily with breakfast. For 5 days 11/10/13  Yes Historical Provider, MD  azelastine (ASTELIN) 0.1 % nasal spray Place 1 spray into both nostrils 2 (two) times daily. Use in each nostril as directed  11/13/13   Hoyle Sauer, MD   BP 139/76  Pulse 81  Temp(Src) 98.9 F (37.2 C)  Resp 16  SpO2 100% Physical Exam  Nursing note and vitals reviewed. Constitutional: She is oriented to person, place, and time. She appears well-developed and well-nourished. No distress.  HENT:  Head: Normocephalic and atraumatic.  Right Ear: External ear normal.  Left Ear: External ear normal.  Nose: Mucosal edema present.  Mouth/Throat: Uvula is midline. Mucous membranes are not pale, not dry and not cyanotic. Posterior oropharyngeal  erythema present. No oropharyngeal exudate, posterior oropharyngeal edema or tonsillar abscesses.  Eyes: Conjunctivae and EOM are normal. Pupils are equal, round, and reactive to light. Right eye exhibits no discharge. Left eye exhibits no discharge. No scleral icterus.  Neck: Normal range of motion and phonation normal. Neck supple. No JVD present. No tracheal tenderness present. No tracheal deviation present. No thyromegaly present.  Cardiovascular: Normal rate, regular rhythm, normal heart sounds and intact distal pulses.  Exam reveals no gallop and no friction rub.   No murmur heard. Pulmonary/Chest: Effort normal and breath sounds normal. No stridor. No respiratory distress. She has no wheezes. She has no rales. She exhibits no tenderness.  Abdominal: Soft. She exhibits no distension.  Musculoskeletal: Normal range of motion. She exhibits no edema and no tenderness.  Lymphadenopathy:    She has no cervical adenopathy.  Neurological: She is alert and oriented to person, place, and time.  Skin: Skin is warm and dry. No rash noted. She is not diaphoretic. No erythema. No pallor.  Psychiatric: She has a normal mood and affect. Her behavior is normal. Judgment and thought content normal.    ED Course  Fiberoptic laryngoscopy Date/Time: 11/13/2013 9:33 PM Performed by: Hoyle Sauer Authorized by: Hoyle Sauer Consent: Verbal consent obtained. Risks and benefits: risks, benefits and alternatives were discussed Consent given by: patient Patient identity confirmed: verbally with patient Local anesthesia used: yes Local anesthetic: topical anesthetic Anesthetic total: 2 ml Patient tolerance: Patient tolerated the procedure well with no immediate complications.   (including critical care time) Labs Review Labs Reviewed - No data to display  Imaging Review Dg Neck Soft Tissue  11/13/2013   CLINICAL DATA:  Sore throat and shortness of breath for 3 days.  EXAM: NECK SOFT TISSUES - 1+  VIEW  COMPARISON:  None.  FINDINGS: There is no evidence of retropharyngeal soft tissue swelling or epiglottic enlargement. The cervical airway is unremarkable and no radio-opaque foreign body identified. Degenerative disc disease is noted at C4-5 and C5-6.  IMPRESSION: 1. Normal soft tissues of the neck. 2. Cervical degenerative disc disease.   Electronically Signed   By: Kerby Moors M.D.   On: 11/13/2013 19:29     EKG Interpretation None      MDM   Final diagnoses:  Sore throat  Environmental allergies   On exam patient is in no acute distress. Symptoms appear stable. Duration of symptoms has been multiple days. Unlikely patient has significant laryngeal edema, or anaphylaxis, due to her stable respiratory status. Patient has symptoms consistent with allergic rhinitis, and chronic postnasal drip. This is likely exacerbating her current symptoms. Also patient has constant exposure to dust and environmental allergens at her work, which are likely exacerbating her symptoms as well. Patient has mild tenderness to the submandibular tissues, however no redness, or induration. Ludwig angina appears unlikely, however will evaluate neck soft tissues with plain films, for screening. No visible PTA, or posterior pharyngeal swelling. Will assess her larynx  by nasolaryngoscopy, and to look for any obvious obstructive process.  On nasolaryngoscopy, patient has generalized redness but no focal swelling over her pharyngeal mucous membranes, no glottic edema, normal vocal cord mobility, and appearance of aretynoids.   Patient is currently taking oral steroids. She would likely benefit long-term from nasal steroids, and avoidance of environmental allergens. Have also advised on nasal rinses, and other adjuvant therapies. Will prescribe nasal antihistamine spray for additional treatment of her postnasal drip, and allergic symptoms. Low concern for acute obstructive process. Some of patient's symptoms may also be  associated with a swallowing dysfunction issue, however she can tolerate liquids and there is no indication for inpatient workup. Have advised for her to see her PCP in the next 2 days, for additional recommendations and referrals as indicated. Have advised to return to the emergency department for any acute worsening in her symptoms, or associated difficulty breathing.  Patient and family given return precautions for allergic reactions.  Advised to return for worsening symptoms including chest pain, shortness of breath, severe headache, intractable nausea or vomiting, fever, or chills, inability to take medications, or other acute concerns.  Advised to follow up with PCP in 2 days.  Patient and family in agreement with and expressed understanding of follow plan, plan of care, and return precautions.  All questions answered prior to discharge.  Patient was discharged in stable condition with family, ambulating without difficulty.  Patient care was discussed with my attending, Dr. Christy Gentles.     Hoyle Sauer, MD 11/14/13 [phone]

## 2013-11-13 NOTE — ED Notes (Signed)
Pt back from X-ray.  

## 2013-11-13 NOTE — ED Provider Notes (Signed)
Patient seen/examined in the Emergency Department in conjunction with Resident Physician Provider brooten Patient reports sore throat and had recent allergic rxn Exam : awake/alert, no stridor, uvula midline, normal phonation Plan: will order soft tissue neck xray.  Pt stable currently   Sharyon Cable, MD 11/13/13 1907

## 2013-11-15 NOTE — ED Provider Notes (Signed)
I have personally seen and examined the patient.  I have discussed the plan of care with the resident.  I have reviewed the documentation on PMH/FH/Soc. History.  I have reviewed the documentation of the resident and agree.   Sharyon Cable, MD 11/15/13 [phone]

## 2014-01-29 ENCOUNTER — Encounter (HOSPITAL_COMMUNITY): Payer: Self-pay | Admitting: Emergency Medicine

## 2014-06-01 ENCOUNTER — Ambulatory Visit
Admission: RE | Admit: 2014-06-01 | Discharge: 2014-06-01 | Disposition: A | Discharge status: Home / Self Care | Payer: BLUE CROSS/BLUE SHIELD | Source: Ambulatory Visit | Attending: Orthopedic Surgery | Admitting: Orthopedic Surgery

## 2014-06-01 ENCOUNTER — Other Ambulatory Visit: Payer: Self-pay | Admitting: Orthopedic Surgery

## 2014-06-01 DIAGNOSIS — M7542 Impingement syndrome of left shoulder: Secondary | ICD-10-CM

## 2016-07-21 ENCOUNTER — Encounter (HOSPITAL_COMMUNITY): Payer: Self-pay | Admitting: Emergency Medicine

## 2016-07-21 ENCOUNTER — Emergency Department (HOSPITAL_COMMUNITY)
Admission: EM | Admit: 2016-07-21 | Discharge: 2016-07-21 | Disposition: A | Discharge status: Home / Self Care | Payer: BC Managed Care – PPO | Attending: Emergency Medicine | Admitting: Emergency Medicine

## 2016-07-21 DIAGNOSIS — Z7984 Long term (current) use of oral hypoglycemic drugs: Secondary | ICD-10-CM | POA: Diagnosis not present

## 2016-07-21 DIAGNOSIS — E119 Type 2 diabetes mellitus without complications: Secondary | ICD-10-CM | POA: Insufficient documentation

## 2016-07-21 DIAGNOSIS — Z79899 Other long term (current) drug therapy: Secondary | ICD-10-CM | POA: Diagnosis not present

## 2016-07-21 DIAGNOSIS — M25512 Pain in left shoulder: Secondary | ICD-10-CM

## 2016-07-21 DIAGNOSIS — Y939 Activity, unspecified: Secondary | ICD-10-CM | POA: Diagnosis not present

## 2016-07-21 DIAGNOSIS — R202 Paresthesia of skin: Secondary | ICD-10-CM | POA: Diagnosis not present

## 2016-07-21 DIAGNOSIS — J45909 Unspecified asthma, uncomplicated: Secondary | ICD-10-CM | POA: Insufficient documentation

## 2016-07-21 DIAGNOSIS — Y92154 Driveway of reform school as the place of occurrence of the external cause: Secondary | ICD-10-CM | POA: Insufficient documentation

## 2016-07-21 DIAGNOSIS — M79602 Pain in left arm: Secondary | ICD-10-CM | POA: Insufficient documentation

## 2016-07-21 DIAGNOSIS — Y999 Unspecified external cause status: Secondary | ICD-10-CM | POA: Diagnosis not present

## 2016-07-21 DIAGNOSIS — S4992XA Unspecified injury of left shoulder and upper arm, initial encounter: Secondary | ICD-10-CM | POA: Diagnosis present

## 2016-07-21 HISTORY — DX: Unspecified osteoarthritis, unspecified site: M19.90

## 2016-07-21 MED ORDER — CYCLOBENZAPRINE HCL 10 MG PO TABS: 10.0000 mg | ORAL_TABLET | Freq: Two times a day (BID) | ORAL | 0 refills | Status: DC | PRN
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
## 2016-07-21 NOTE — Discharge Instructions (Addendum)
Please use ibuprofen or naproxen as needed for muscle pain. Please use warm compresses the area, massaging, stretching. Please follow-up with your primary care provider in 1-2 weeks as needed.  Get help right away if: You have: Numbness, tingling, or weakness in your arms or legs. Severe neck pain, especially tenderness in the middle of the back of your neck. Changes in bowel or bladder control. Increasing pain in any area of your body. Shortness of breath or light-headedness. Chest pain. Blood in your urine, stool, or vomit. Severe pain in your abdomen or your back. Severe or worsening headaches. Sudden vision loss or double vision. Your eye suddenly becomes red. Your pupil is an odd shape or size.

## 2016-07-28 ENCOUNTER — Ambulatory Visit
Admission: RE | Admit: 2016-07-28 | Discharge: 2016-07-28 | Disposition: A | Discharge status: Home / Self Care | Payer: BC Managed Care – PPO | Source: Ambulatory Visit | Attending: Family Medicine | Admitting: Family Medicine

## 2016-07-28 ENCOUNTER — Other Ambulatory Visit: Payer: Self-pay | Admitting: Family Medicine

## 2016-07-28 DIAGNOSIS — M545 Low back pain: Secondary | ICD-10-CM

## 2016-07-28 DIAGNOSIS — M25512 Pain in left shoulder: Secondary | ICD-10-CM

## 2017-05-26 ENCOUNTER — Emergency Department (HOSPITAL_COMMUNITY): Payer: BC Managed Care – PPO

## 2017-05-26 ENCOUNTER — Emergency Department (HOSPITAL_COMMUNITY)
Admission: EM | Admit: 2017-05-26 | Discharge: 2017-05-26 | Disposition: A | Discharge status: Home / Self Care | Payer: BC Managed Care – PPO | Attending: Emergency Medicine | Admitting: Emergency Medicine

## 2017-05-26 ENCOUNTER — Encounter (HOSPITAL_COMMUNITY): Payer: Self-pay | Admitting: Emergency Medicine

## 2017-05-26 DIAGNOSIS — M546 Pain in thoracic spine: Secondary | ICD-10-CM | POA: Diagnosis not present

## 2017-05-26 DIAGNOSIS — E119 Type 2 diabetes mellitus without complications: Secondary | ICD-10-CM | POA: Diagnosis not present

## 2017-05-26 DIAGNOSIS — R0602 Shortness of breath: Secondary | ICD-10-CM | POA: Diagnosis present

## 2017-05-26 DIAGNOSIS — M940 Chondrocostal junction syndrome [Tietze]: Secondary | ICD-10-CM | POA: Diagnosis not present

## 2017-05-26 DIAGNOSIS — Z7984 Long term (current) use of oral hypoglycemic drugs: Secondary | ICD-10-CM | POA: Insufficient documentation

## 2017-05-26 LAB — CBC WITH DIFFERENTIAL/PLATELET
BASOS PCT: 0 %
Basophils Absolute: 0 10*3/uL (ref 0.0–0.1)
Eosinophils Absolute: 0.1 10*3/uL (ref 0.0–0.7)
Eosinophils Relative: 1 %
HCT: 36.7 % (ref 36.0–46.0)
Hemoglobin: 11.9 g/dL — ABNORMAL LOW (ref 12.0–15.0)
LYMPHS ABS: 3.1 10*3/uL (ref 0.7–4.0)
Lymphocytes Relative: 38 %
MCH: 29.2 pg (ref 26.0–34.0)
MCHC: 32.4 g/dL (ref 30.0–36.0)
MCV: 90.2 fL (ref 78.0–100.0)
MONO ABS: 0.8 10*3/uL (ref 0.1–1.0)
MONOS PCT: 9 %
Neutro Abs: 4.2 10*3/uL (ref 1.7–7.7)
Neutrophils Relative %: 52 %
PLATELETS: 362 10*3/uL (ref 150–400)
RBC: 4.07 MIL/uL (ref 3.87–5.11)
RDW: 14.9 % (ref 11.5–15.5)
WBC: 8.2 10*3/uL (ref 4.0–10.5)

## 2017-05-26 LAB — BASIC METABOLIC PANEL
Anion gap: 8 (ref 5–15)
BUN: 12 mg/dL (ref 6–20)
CALCIUM: 9.3 mg/dL (ref 8.9–10.3)
CHLORIDE: 107 mmol/L (ref 101–111)
CO2: 25 mmol/L (ref 22–32)
CREATININE: 0.75 mg/dL (ref 0.44–1.00)
GFR calc Af Amer: >60 mL/min (ref >60)
Glucose, Bld: 138 mg/dL — ABNORMAL HIGH (ref 65–99)
Potassium: 3.6 mmol/L (ref 3.5–5.1)
SODIUM: 140 mmol/L (ref 135–145)

## 2017-05-26 LAB — I-STAT BETA HCG BLOOD, ED (MC, WL, AP ONLY): I-stat hCG, quantitative: <5 m[IU]/mL (ref <5)

## 2017-05-26 LAB — D-DIMER, QUANTITATIVE (NOT AT ARMC): D DIMER QUANT: 0.42 ug{FEU}/mL (ref 0.00–0.50)

## 2017-05-26 MED ORDER — CYCLOBENZAPRINE HCL 10 MG PO TABS: 10.0000 mg | ORAL_TABLET | Freq: Two times a day (BID) | ORAL | 0 refills | Status: DC | PRN

## 2017-05-26 MED ORDER — ALBUTEROL SULFATE (2.5 MG/3ML) 0.083% IN NEBU
5.0000 mg | INHALATION_SOLUTION | Freq: Once | RESPIRATORY_TRACT | Status: AC
  Administered 2017-05-26: 5 mg via RESPIRATORY_TRACT
  Filled 2017-05-26: qty 6

## 2017-05-26 MED ORDER — KETOROLAC TROMETHAMINE 30 MG/ML IJ SOLN
30.0000 mg | Freq: Once | INTRAMUSCULAR | Status: AC
  Administered 2017-05-26: 30 mg via INTRAVENOUS
  Filled 2017-05-26: qty 1

## 2017-05-26 NOTE — ED Notes (Signed)
No respiratory or acute distress noted alert and oriented x 3 call light in reach visitor at bedside able to speak in full sentences.

## 2017-05-26 NOTE — ED Notes (Signed)
Patient aware we need a urine sample. Patient unable to void at this time.

## 2017-05-26 NOTE — Discharge Instructions (Signed)
Continue taking your anti inflammatory medication for your chest and back pain.  Take flexeril as needed for muscle spasm.  Follow up with your doctor for further care.  Return if you develop fever, worsening shortness of breath, coughing up blood or if you have other concerns.

## 2017-05-26 NOTE — ED Provider Notes (Signed)
[facility] DEPT Provider Note   CSN: [id number] Arrival date & time: 05/26/17  1641     History   Chief Complaint Chief Complaint  Patient presents with  . Shortness of Breath  . Back Pain    HPI Danielle Chang is a 47 y.o. female.  HPI   47 year old female with history of asthma, diabetes, hyperlipidemia presenting for evaluation of shortness of breath.  Patient report for the past 2 days she has had pain to her upper back as well as her right side of chest.  Pain is waxing waning described as a sharp stabbing sensation, sometimes worse with movement sometimes worse with breathing.  This morning she was wheezing.  Also endorsed nausea.  She has been taking an anti-inflammatory medication Zorvolex which did help pain temporarily.  She denies any fever, chills, lightheadedness, dizziness, runny nose, sneezing, coughing, abdominal pain, dysuria or rash.  No prior history of PE or DVT, no recent surgery, prolonged bed rest, your leg swelling or calf pain.  She is on the Mirena.   Past Medical History:  Diagnosis Date  . Arthritis   . Asthma   . Diabetes mellitus   . Dysmenorrhea   . Menorrhagia   . Ovarian cyst     Patient Active Problem List   Diagnosis Date Noted  . Bartholin's gland cyst 07/14/2011  . DIABETES MELLITUS, TYPE II 03/21/2007  . GLUCOSE INTOLERANCE 03/21/2007  . HYPERLIPIDEMIA 03/21/2007  . ALLERGIC RHINITIS 03/21/2007  . ASTHMA 03/21/2007  . FREQUENCY, URINARY 03/21/2007    History reviewed. No pertinent surgical history.  OB History    Gravida Para Term Preterm AB Living   1 1 1     1    SAB TAB Ectopic Multiple Live Births                   Home Medications    Prior to Admission medications   Medication Sig Start Date End Date Taking? Authorizing Provider  azelastine (ASTELIN) 0.1 % nasal spray Place 1 spray into both nostrils 2 (two) times daily. Use in each nostril as directed 11/13/13   Hoyle Sauer, MD    cyclobenzaprine (FLEXERIL) 10 MG tablet Take 1 tablet (10 mg total) by mouth 2 (two) times daily as needed for muscle spasms. 07/21/16   Bettey Costa, PA  Exenatide ER (BYDUREON) 2 MG PEN Inject 2 mg into the skin once a week.    [provider]  ibuprofen (ADVIL,MOTRIN) 200 MG tablet Take 400 mg by mouth every 6 (six) hours as needed for headache or moderate pain.    [provider]  metFORMIN (GLUCOPHAGE) 500 MG tablet Take 500 mg by mouth daily.     [provider]  Multiple Vitamin (MULTIVITAMIN) tablet Take 1 tablet by mouth daily.    [provider]  predniSONE (DELTASONE) 20 MG tablet Take 40 mg by mouth daily with breakfast. For 5 days 11/10/13   [provider]    Family History Family History  Problem Relation Age of Onset  . Fibromyalgia Mother   . Hypertension Mother   . Depression Mother   . Cancer Father        prostate  . Hypertension Sister   . Hypertension Maternal Grandmother   . Stroke Maternal Grandmother   . Hypertension Maternal Grandfather   . Diabetes Paternal Grandmother     Social History Social History   Tobacco Use  . Smoking status: Never Smoker  .  Smokeless tobacco: Never Used  Substance Use Topics  . Alcohol use: No  . Drug use: No     Allergies   Levofloxacin   Review of Systems Review of Systems  All other systems reviewed and are negative.    Physical Exam Updated Vital Signs BP (!) 147/83 (BP Location: Right Arm)   Pulse 95   Temp 98.5 F (36.9 C) (Oral)   Resp 20   SpO2 100%   Physical Exam  Constitutional: She appears well-developed and well-nourished. No distress.  Obese female sitting in the in no acute discomfort.  HENT:  Head: Atraumatic.  Eyes: Conjunctivae are normal.  Neck: Neck supple. No JVD present.  Cardiovascular: Normal rate and regular rhythm.  Pulmonary/Chest: Effort normal and breath sounds normal. No respiratory distress. She has no wheezes. She  has no rhonchi. She has no rales. She exhibits tenderness (Tenderness to upper anterior chest wall without any crepitus or emphysema).  Abdominal: Soft. There is no tenderness.  Musculoskeletal:       Right lower leg: She exhibits no edema.       Left lower leg: She exhibits no edema.  Tenderness to midline thoracic spine on palpation without crepitus or step-off.  Neurological: She is alert.  Skin: Capillary refill takes less than 2 seconds. No rash noted.  Psychiatric: She has a normal mood and affect.  Nursing note and vitals reviewed.    ED Treatments / Results  Labs (all labs ordered are listed, but only abnormal results are displayed) Labs Reviewed  CBC WITH DIFFERENTIAL/PLATELET - Abnormal; Notable for the following components:      Result Value   Hemoglobin 11.9 (*)    All other components within normal limits  BASIC METABOLIC PANEL - Abnormal; Notable for the following components:   Glucose, Bld 138 (*)    All other components within normal limits  D-DIMER, QUANTITATIVE (NOT AT [facility])  I-STAT BETA HCG BLOOD, ED ([facility], [facility], [facility] ONLY)  POC URINE PREG, ED    EKG  EKG Interpretation  Date/Time:  Wednesday May 26 2017 16:52:48 EST Ventricular Rate:  99 PR Interval:    QRS Duration: 73 QT Interval:  337 QTC Calculation: 433 R Axis:   58 Text Interpretation:  Sinus rhythm Probable left atrial enlargement Low voltage, precordial leads Baseline wander in lead(s) V6 Confirmed by Nat Christen [phone]) on 05/26/2017 6:37:21 PM     ED ECG REPORT   Date: 05/26/2017  Rate: 99  Rhythm: normal sinus rhythm  QRS Axis: normal  Intervals: normal  ST/T Wave abnormalities: normal  Conduction Disutrbances:none  Narrative Interpretation:   Old EKG Reviewed: none available  I have personally reviewed the EKG tracing and agree with the computerized printout as noted.   Radiology Dg Chest 2 View  Result Date: 05/26/2017 CLINICAL DATA:  Chest pain and shortness of breath EXAM:  CHEST  2 VIEW COMPARISON:  None. FINDINGS: Lungs are clear. Heart size and pulmonary vascularity are normal. No adenopathy. No bone lesions. No pneumothorax IMPRESSION: No edema or consolidation. Electronically Signed   By: Lowella Grip III M.D.   On: 05/26/2017 17:39    Procedures Procedures (including critical care time)  Medications Ordered in ED Medications  albuterol (PROVENTIL) (2.5 MG/3ML) 0.083% nebulizer solution 5 mg (5 mg Nebulization Given 05/26/17 1656)  ketorolac (TORADOL) 30 MG/ML injection 30 mg (30 mg Intravenous Given 05/26/17 1759)     Initial Impression / Assessment and Plan / ED Course  I have reviewed the triage  vital signs and the nursing notes.  Pertinent labs & imaging results that were available during my care of the patient were reviewed by me and considered in my medical decision making (see chart for details).     BP 125/76   Pulse 95   Temp 98.5 F (36.9 C) (Oral)   Resp 20   SpO2 99%    Final Clinical Impressions(s) / ED Diagnoses   Final diagnoses:  Costochondritis, acute    ED Discharge Orders        Ordered    cyclobenzaprine (FLEXERIL) 10 MG tablet  2 times daily PRN     05/26/17 2029     5:55 PM Patient complaining of pleuritic chest pain as well as reproducible chest wall pain.  She reportedly was having some wheezing earlier improved with breathing treatment while in the ER.  No active wheezing currently.  Chest x-ray without any concerning findings.  Her initial EKG is without concerning arrhythmia or ischemic changes.  Due to the pleuritic nature of her pain, will obtain a d-dimer along with further workup.  Toradol given for pain.  8:25 PM Fortunately her d-dimer is negative therefore low suspicion for PE. Doubt dissection. Her pregnancy test is negative, labs are reassuring, EKG and chest x-ray unremarkable.  Patient is currently resting comfortably.  Current treatment did help.  I encourage patient to continue taking her  anti-inflammatory medication at home, she may follow-up with her primary care provider for further care.  Return precautions discussed.   Domenic Moras, PA-C 05/26/17 2032    Nat Christen, MD 05/28/17 2034

## 2017-05-26 NOTE — ED Triage Notes (Signed)
Patient presents ambulatory with labored breathing stating right sided chest pain, back pain and difficulty breathing onset of yesterday. Wheezing noted in triage. Breathing treatment started.

## 2019-01-12 ENCOUNTER — Ambulatory Visit
Admission: RE | Admit: 2019-01-12 | Discharge: 2019-01-12 | Disposition: A | Discharge status: Home / Self Care | Payer: BC Managed Care – PPO | Source: Ambulatory Visit | Attending: Family Medicine | Admitting: Family Medicine

## 2019-01-12 ENCOUNTER — Other Ambulatory Visit: Payer: Self-pay | Admitting: Family Medicine

## 2019-01-12 DIAGNOSIS — R52 Pain, unspecified: Secondary | ICD-10-CM

## 2019-03-03 ENCOUNTER — Other Ambulatory Visit: Payer: Self-pay

## 2019-03-03 DIAGNOSIS — Z20822 Contact with and (suspected) exposure to covid-19: Secondary | ICD-10-CM

## 2019-03-05 LAB — NOVEL CORONAVIRUS, NAA: SARS-CoV-2, NAA: NOT DETECTED

## 2019-04-07 ENCOUNTER — Ambulatory Visit: Payer: BC Managed Care – PPO | Attending: Internal Medicine

## 2019-04-07 DIAGNOSIS — Z20822 Contact with and (suspected) exposure to covid-19: Secondary | ICD-10-CM

## 2019-04-09 LAB — NOVEL CORONAVIRUS, NAA: SARS-CoV-2, NAA: NOT DETECTED

## 2020-08-21 ENCOUNTER — Ambulatory Visit
Admission: RE | Admit: 2020-08-21 | Discharge: 2020-08-21 | Disposition: A | Discharge status: Home / Self Care | Payer: BC Managed Care – PPO | Source: Ambulatory Visit | Attending: Family Medicine | Admitting: Family Medicine

## 2020-08-21 ENCOUNTER — Other Ambulatory Visit: Payer: Self-pay | Admitting: Family Medicine

## 2020-08-21 DIAGNOSIS — R52 Pain, unspecified: Secondary | ICD-10-CM

## 2020-09-18 ENCOUNTER — Other Ambulatory Visit: Payer: Self-pay | Admitting: Endocrinology

## 2020-09-18 ENCOUNTER — Other Ambulatory Visit (HOSPITAL_COMMUNITY): Payer: Self-pay | Admitting: Endocrinology

## 2020-09-18 DIAGNOSIS — E0590 Thyrotoxicosis, unspecified without thyrotoxic crisis or storm: Secondary | ICD-10-CM

## 2020-09-20 ENCOUNTER — Ambulatory Visit: Payer: BC Managed Care – PPO | Admitting: Allergy

## 2020-10-07 ENCOUNTER — Other Ambulatory Visit (HOSPITAL_COMMUNITY): Payer: BC Managed Care – PPO

## 2020-10-07 ENCOUNTER — Ambulatory Visit (HOSPITAL_COMMUNITY): Payer: BC Managed Care – PPO

## 2020-10-08 ENCOUNTER — Other Ambulatory Visit (HOSPITAL_COMMUNITY): Payer: BC Managed Care – PPO

## 2020-10-19 ENCOUNTER — Other Ambulatory Visit: Payer: Self-pay | Admitting: Rehabilitation

## 2020-10-19 DIAGNOSIS — M5412 Radiculopathy, cervical region: Secondary | ICD-10-CM

## 2020-10-23 ENCOUNTER — Ambulatory Visit (HOSPITAL_COMMUNITY)
Admission: RE | Admit: 2020-10-23 | Discharge: 2020-10-23 | Disposition: A | Discharge status: Home / Self Care | Payer: BC Managed Care – PPO | Source: Ambulatory Visit | Attending: Endocrinology | Admitting: Endocrinology

## 2020-10-23 ENCOUNTER — Other Ambulatory Visit: Payer: Self-pay

## 2020-10-23 ENCOUNTER — Encounter (HOSPITAL_COMMUNITY)
Admission: RE | Admit: 2020-10-23 | Discharge: 2020-10-23 | Disposition: A | Discharge status: Home / Self Care | Payer: BC Managed Care – PPO | Source: Ambulatory Visit | Attending: Endocrinology | Admitting: Endocrinology

## 2020-10-23 DIAGNOSIS — E0590 Thyrotoxicosis, unspecified without thyrotoxic crisis or storm: Secondary | ICD-10-CM | POA: Diagnosis present

## 2020-10-23 MED ORDER — SODIUM IODIDE I-123 7.4 MBQ CAPS
446.7000 | ORAL_CAPSULE | Freq: Once | ORAL | Status: AC
  Administered 2020-10-23: 446.7 via ORAL

## 2020-10-24 ENCOUNTER — Ambulatory Visit (HOSPITAL_COMMUNITY)
Admission: RE | Admit: 2020-10-24 | Discharge: 2020-10-24 | Disposition: A | Discharge status: Home / Self Care | Payer: BC Managed Care – PPO | Source: Ambulatory Visit | Attending: Endocrinology | Admitting: Endocrinology

## 2020-10-28 ENCOUNTER — Ambulatory Visit
Admission: RE | Admit: 2020-10-28 | Discharge: 2020-10-28 | Disposition: A | Discharge status: Home / Self Care | Payer: BC Managed Care – PPO | Source: Ambulatory Visit | Attending: Rehabilitation | Admitting: Rehabilitation

## 2020-10-28 ENCOUNTER — Other Ambulatory Visit: Payer: Self-pay

## 2020-10-28 DIAGNOSIS — M5412 Radiculopathy, cervical region: Secondary | ICD-10-CM

## 2020-10-31 ENCOUNTER — Other Ambulatory Visit: Payer: Self-pay | Admitting: Endocrinology

## 2020-10-31 DIAGNOSIS — E0590 Thyrotoxicosis, unspecified without thyrotoxic crisis or storm: Secondary | ICD-10-CM

## 2020-11-11 ENCOUNTER — Ambulatory Visit
Admission: RE | Admit: 2020-11-11 | Discharge: 2020-11-11 | Disposition: A | Discharge status: Home / Self Care | Payer: BC Managed Care – PPO | Source: Ambulatory Visit | Attending: Endocrinology | Admitting: Endocrinology

## 2020-11-11 DIAGNOSIS — E0590 Thyrotoxicosis, unspecified without thyrotoxic crisis or storm: Secondary | ICD-10-CM

## 2020-12-03 ENCOUNTER — Encounter: Payer: Self-pay | Admitting: Neurology

## 2020-12-09 ENCOUNTER — Ambulatory Visit: Payer: BC Managed Care – PPO | Admitting: Allergy

## 2020-12-30 ENCOUNTER — Other Ambulatory Visit: Payer: Self-pay

## 2020-12-30 DIAGNOSIS — G629 Polyneuropathy, unspecified: Secondary | ICD-10-CM

## 2020-12-31 ENCOUNTER — Other Ambulatory Visit: Payer: Self-pay

## 2020-12-31 ENCOUNTER — Ambulatory Visit (INDEPENDENT_AMBULATORY_CARE_PROVIDER_SITE_OTHER): Payer: BC Managed Care – PPO | Admitting: Neurology

## 2020-12-31 DIAGNOSIS — G629 Polyneuropathy, unspecified: Secondary | ICD-10-CM | POA: Diagnosis not present

## 2020-12-31 DIAGNOSIS — G5622 Lesion of ulnar nerve, left upper limb: Secondary | ICD-10-CM

## 2020-12-31 DIAGNOSIS — G5602 Carpal tunnel syndrome, left upper limb: Secondary | ICD-10-CM

## 2020-12-31 NOTE — Procedures (Signed)
[facility]  [place], [address]  [place], [place] [address] Tel: [phone] Fax:  [phone] Test Date:  12/31/2020  Patient: Danielle Chang DOB: 20-Feb-1971 Physician: Narda Amber, DO  Sex: Female Height: 5\' 2"  Ref Phys: Beau Fanny, PA  ID#: [id number]   Technician:    Patient Complaints: This is a 50 year old female referred for evaluation of left arm paresthesias.  NCV & EMG Findings: Extensive electrodiagnostic testing of the left upper extremity shows:  Left median sensory response shows prolonged latency (4.0 ms).  Left ulnar sensory response is within normal limits. Left median motor responses within normal limits.  Left ulnar motor response shows slowed conduction velocity across the elbow (A Elbow-B Elbow, 42 m/s).   There is no evidence of active or chronic motor axonal loss changes affecting any of the tested muscles.  Motor unit configuration and recruitment pattern is within normal limits.    Impression: Left median neuropathy at or distal to the wrist, consistent with a clinical diagnosis of carpal tunnel syndrome.  Overall, these findings are mild in degree electrically Left ulnar neuropathy with slowing across the elbow, mild. There is no evidence of a left cervical radiculopathy.   ___________________________ Narda Amber, DO    Nerve Conduction Studies Anti Sensory Summary Table   Stim Site NR Peak (ms) Norm Peak (ms) P-T Amp (V) Norm P-T Amp  Left Median Anti Sensory (2nd Digit)  33C  Wrist    4.0 <3.4 42.2 >20  Left Ulnar Anti Sensory (5th Digit)  33C  Wrist    2.7 <3.1 75.3 >12   Motor Summary Table   Stim Site NR Onset (ms) Norm Onset (ms) O-P Amp (mV) Norm O-P Amp Site1 Site2 Delta-0 (ms) Dist (cm) Vel (m/s) Norm Vel (m/s)  Left Median Motor (Abd Poll Brev)  33C  Wrist    3.8 <3.9 12.6 >6 Elbow Wrist 5.0 28.0 56 >50  Elbow    8.8  11.7         Left Ulnar Motor (Abd Dig Minimi)  33C  Wrist    2.3 <3.1 11.9 >7 B  Elbow Wrist 3.0 20.0 67 >50  B Elbow    5.3  11.0  A Elbow B Elbow 2.4 10.0 42 >50  A Elbow    7.7  10.6          EMG   Side Muscle Ins Act Fibs Psw Fasc Number Recrt Dur Dur. Amp Amp. Poly Poly. Comment  Left 1stDorInt Nml Nml Nml Nml Nml Nml Nml Nml Nml Nml Nml Nml N/A  Left Abd Poll Brev Nml Nml Nml Nml Nml Nml Nml Nml Nml Nml Nml Nml N/A  Left PronatorTeres Nml Nml Nml Nml Nml Nml Nml Nml Nml Nml Nml Nml N/A  Left Biceps Nml Nml Nml Nml Nml Nml Nml Nml Nml Nml Nml Nml N/A  Left Triceps Nml Nml Nml Nml Nml Nml Nml Nml Nml Nml Nml Nml N/A  Left Deltoid Nml Nml Nml Nml Nml Nml Nml Nml Nml Nml Nml Nml N/A  Left ABD Dig Min Nml Nml Nml Nml Nml Nml Nml Nml Nml Nml Nml Nml N/A  Left FlexCarpiUln Nml Nml Nml Nml Nml Nml Nml Nml Nml Nml Nml Nml N/A      Waveforms:

## 2021-02-04 ENCOUNTER — Ambulatory Visit: Payer: BC Managed Care – PPO | Admitting: Allergy & Immunology

## 2021-04-01 ENCOUNTER — Ambulatory Visit: Payer: BC Managed Care – PPO | Admitting: Allergy & Immunology

## 2021-04-01 ENCOUNTER — Encounter: Payer: Self-pay | Admitting: Allergy & Immunology

## 2021-04-01 ENCOUNTER — Other Ambulatory Visit: Payer: Self-pay

## 2021-04-01 VITALS — BP 118/70 | HR 98 | Temp 98.5°F | Resp 17 | Ht 63.0 in | Wt 174.0 lb

## 2021-04-01 DIAGNOSIS — T783XXD Angioneurotic edema, subsequent encounter: Secondary | ICD-10-CM

## 2021-04-01 DIAGNOSIS — J302 Other seasonal allergic rhinitis: Secondary | ICD-10-CM

## 2021-04-01 DIAGNOSIS — Z8709 Personal history of other diseases of the respiratory system: Secondary | ICD-10-CM | POA: Diagnosis not present

## 2021-04-01 DIAGNOSIS — J3089 Other allergic rhinitis: Secondary | ICD-10-CM

## 2021-04-01 DIAGNOSIS — J310 Chronic rhinitis: Secondary | ICD-10-CM

## 2021-04-01 DIAGNOSIS — T783XXA Angioneurotic edema, initial encounter: Secondary | ICD-10-CM

## 2021-04-01 NOTE — Progress Notes (Signed)
NEW PATIENT  Date of Service/Encounter:  04/01/21  Consult requested by: Lucianne Lei, MD   Assessment:   Angioedema - unknown trigger  Seasonal and perennial allergic rhinitis (trees, indoor molds, and outdoor molds)  History of asthma  Plan/Recommendations:   1. Angioedema - Testing was positive only to trees as well as indoor and outdoor molds. - Copy of testing results provided. - Avoidance measures provided. - I am unsure if these are relevant, but we will see. - We are going to get some lab work to see if there is something more serious going on (autoimmune issues, serum tryptase, and stinging insect panel). - We will call you in 1-2 weeks with the results of the testing. - Anaphylaxis management plan provided. - EpiPen training and script provided.  - Come see is next time that this happens.   2. Chronic rhinitis - Testing today showed: trees, indoor molds, and outdoor molds. - Copy of test results provided.  - Avoidance measures provided. - Start taking: Allegra OR Claritin (alternate every 1-2 months) - You can use an extra dose of the antihistamine, if needed, for breakthrough symptoms.  - Consider nasal saline rinses 1-2 times daily to remove allergens from the nasal cavities as well as help with mucous clearance (this is especially helpful to do before the nasal sprays are given)  3. Return in about 3 months (around 06/30/2021).      This note in its entirety was forwarded to the Provider who requested this consultation.  Subjective:   Danielle Chang is a 51 y.o. female presenting today for evaluation of  Chief Complaint  Patient presents with   Allergic Rhinitis     [name] has a history of the following: Patient Active Problem List   Diagnosis Date Noted   Angio-edema 04/02/2021   Seasonal and perennial allergic rhinitis 04/02/2021   History of asthma 04/02/2021   Bartholin's gland cyst 07/14/2011   DIABETES MELLITUS, TYPE II  03/21/2007   GLUCOSE INTOLERANCE 03/21/2007   HYPERLIPIDEMIA 03/21/2007   ALLERGIC RHINITIS 03/21/2007   ASTHMA 03/21/2007   FREQUENCY, URINARY 03/21/2007    History obtained from: chart review and patient.  Danielle Chang was referred by Lucianne Lei, MD.     Danielle Chang is a 51 y.o. female presenting for an evaluation of swelling . She works for [employer] in [place] in the [employer] area. There is a large number of drivers that are down.   She is here today for evaluation of angioedema. It started around a few years ago when she woke up with a swollen face and tongue. She will say that these happen occasionally and are usually between spring and fall. This is mostly itchy and not very painful. When it happens, it sticks around for a few days. She estimates that this occurs every 2 months or so. It is not always when she wakes up in the morning. The last time that it happened in May 2022, she thought she had reacted to something that they sprayed.   Review of the chart shows that she first was seen in the ED in August 2015 with her first reaction. She received steroids, H1/H2 blockers, and diphenhydramine.   She does have some hives with this, but this is more of an itching sensation. She does feel that her throat is involved. She denies diarrhea or vomiting, but she does have some nausea. It is not always associated with food. She seems to  tolerate all of the major food allergens without adverse event. She did not start any medications around the time that this started. She does not remember any medication that she started around the time that the swelling episodes started.    Asthma/Respiratory Symptom History: She did have asthma when she was much younger. She has not needed an inhaler in quite some time, likely years. She has not required any prednisone for her breathing. She denies nighttime coughing or wheezing.   Allergic Rhinitis Symptom History: She  does have some rhinorrhea and itchy watery eyes. This is worse in the spring and the fall. She doe snot have any sinus infections or ear infections often.   Otherwise, there is no history of other atopic diseases, including food allergies, drug allergies, stinging insect allergies, eczema, urticaria, or contact dermatitis. There is no significant infectious history. Vaccinations are up to date.    Past Medical History: Patient Active Problem List   Diagnosis Date Noted   Angio-edema 04/02/2021   Seasonal and perennial allergic rhinitis 04/02/2021   History of asthma 04/02/2021   Bartholin's gland cyst 07/14/2011   DIABETES MELLITUS, TYPE II 03/21/2007   GLUCOSE INTOLERANCE 03/21/2007   HYPERLIPIDEMIA 03/21/2007   ALLERGIC RHINITIS 03/21/2007   ASTHMA 03/21/2007   FREQUENCY, URINARY 03/21/2007    Medication List:  Allergies as of 04/01/2021       Reactions   Levofloxacin Shortness Of Breath, Itching, Other (See Comments)        Medication List        Accurate as of April 01, 2021 11:59 PM. If you have any questions, ask your nurse or doctor.          cyclobenzaprine 10 MG tablet Commonly known as: FLEXERIL Take 1 tablet (10 mg total) by mouth 2 (two) times daily as needed for muscle spasms.   etodolac 400 MG tablet Commonly known as: LODINE Take 400 mg by mouth 2 (two) times daily as needed for moderate pain.   Farxiga 10 MG Tabs tablet Generic drug: dapagliflozin propanediol Take 10 mg by mouth every morning.   FreeStyle Libre 2 Sensor Misc CHANGE SENSOR EVERY 14 DAYS   hydrOXYzine 25 MG capsule Commonly known as: VISTARIL SMARTSIG:1-2 Capsule(s) By Mouth Every 6-8 Hours PRN   ibuprofen 200 MG tablet Commonly known as: ADVIL Take 400 mg by mouth every 6 (six) hours as needed for headache or moderate pain.   metFORMIN 500 MG tablet Commonly known as: GLUCOPHAGE Take 500 mg by mouth daily.   methimazole 10 MG tablet Commonly known as: TAPAZOLE Take 10  mg by mouth 2 (two) times daily.   OneTouch Ultra test strip Generic drug: glucose blood CHECK GLUCOSE 3 TIMES A DAY   Rybelsus 14 MG Tabs Generic drug: Semaglutide Take by mouth.   Trulicity 1.5 WU/9.8JX Sopn Generic drug: Dulaglutide Inject into the skin.   Victoza 18 MG/3ML Sopn Generic drug: liraglutide INJECT 1.8MG  EVERY DAY   Vitamin D (Ergocalciferol) 1.25 MG (50000 UNIT) Caps capsule Commonly known as: DRISDOL Take 50,000 Units by mouth once a week.   Zorvolex 35 MG Caps Generic drug: Diclofenac TK 1 C PO TID        Birth History: non-contributory  Developmental History: non-contributory  Past Surgical History: History reviewed. No pertinent surgical history.   Family History: Family History  Problem Relation Age of Onset   Asthma Mother    Fibromyalgia Mother    Hypertension Mother    Depression Mother    Cancer  Father        prostate   Hypertension Sister    Hypertension Maternal Grandmother    Stroke Maternal Grandmother    Hypertension Maternal Grandfather    Diabetes Paternal Grandmother      Social History: Danielle Chang lives at home with her family.  She lives in a house that is 49 years old.  There is laminate flooring throughout the home.  She has electric heating and central cooling.  There are no animals inside or outside of the home.  There are dust mite covers on the bed, but not the pillows.  There is no tobacco exposure.  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  She is not exposed to fumes, chemicals, or dust.  She does not use a HEPA filter.  She [place] an interstate or industrial area.   Review of Systems  Constitutional: Negative.  Negative for chills, fever, malaise/fatigue and weight loss.  HENT:  Positive for congestion. Negative for ear discharge, ear pain and sinus pain.   Eyes:  Negative for pain, discharge and redness.  Respiratory:  Negative for cough, sputum production, shortness of breath and  wheezing.   Cardiovascular: Negative.  Negative for chest pain and palpitations.  Gastrointestinal:  Negative for abdominal pain, constipation, diarrhea, heartburn, nausea and vomiting.  Skin: Negative.  Negative for itching and rash.  Neurological:  Negative for dizziness and headaches.  Endo/Heme/Allergies:  Positive for environmental allergies. Does not bruise/bleed easily.      Objective:   Blood pressure 118/70, pulse 98, temperature 98.5 F (36.9 C), temperature source Temporal, resp. rate 17, height 5\' 3"  (1.6 m), weight 174 lb (78.9 kg), SpO2 99 %. Body mass index is 30.82 kg/m.   Physical Exam:   Physical Exam Vitals reviewed.  Constitutional:      Appearance: She is well-developed.     Comments: Pleasant female. Talkative.  HENT:     Head: Normocephalic and atraumatic.     Right Ear: Tympanic membrane, ear canal and external ear normal. No drainage, swelling or tenderness. Tympanic membrane is not injected, scarred, erythematous, retracted or bulging.     Left Ear: Tympanic membrane, ear canal and external ear normal. No drainage, swelling or tenderness. Tympanic membrane is not injected, scarred, erythematous, retracted or bulging.     Nose: Mucosal edema and rhinorrhea present. No nasal deformity or septal deviation.     Right Turbinates: Enlarged, swollen and pale.     Left Turbinates: Enlarged, swollen and pale.     Right Sinus: No maxillary sinus tenderness or frontal sinus tenderness.     Left Sinus: No maxillary sinus tenderness or frontal sinus tenderness.     Mouth/Throat:     Mouth: Mucous membranes are not pale and not dry.     Pharynx: Uvula midline.     Comments: Some cobblestoning present. Airway clearly open today. Eyes:     General:        Right eye: No discharge.        Left eye: No discharge.     Conjunctiva/sclera: Conjunctivae normal.     Right eye: Right conjunctiva is not injected. No chemosis.    Left eye: Left conjunctiva is not injected.  No chemosis.    Pupils: Pupils are equal, round, and reactive to light.  Cardiovascular:     Rate and Rhythm: Normal rate and regular rhythm.     Heart sounds: Normal heart sounds.  Pulmonary:  Effort: Pulmonary effort is normal. No tachypnea, accessory muscle usage or respiratory distress.     Breath sounds: Normal breath sounds. No wheezing, rhonchi or rales.  Chest:     Chest wall: No tenderness.  Abdominal:     Tenderness: There is no abdominal tenderness. There is no guarding or rebound.  Lymphadenopathy:     Head:     Right side of head: No submandibular, tonsillar or occipital adenopathy.     Left side of head: No submandibular, tonsillar or occipital adenopathy.     Cervical: No cervical adenopathy.  Skin:    General: Skin is warm.     Capillary Refill: Capillary refill takes less than 2 seconds.     Coloration: Skin is not pale.     Findings: No abrasion, erythema, petechiae or rash. Rash is not papular, urticarial or vesicular.     Comments: No excoriations noted.   Neurological:     Mental Status: She is alert.  Psychiatric:        Behavior: Behavior is cooperative.     Diagnostic studies:     Allergy Studies:     Airborne Adult Perc - 04/01/21 1414     Time Antigen Placed 1414    Allergen Manufacturer Lavella Hammock    Location Back    Number of Test 59    Panel 1 Select    1. Control-Buffer 50% Glycerol Negative    2. Control-Histamine 1 mg/ml 2+    3. Albumin saline Negative    4. [place] Negative    5. [place] Negative    6. [place] Negative    7. [place] Blue Negative    8. Meadow Fescue Negative    9. Perennial Rye Negative    10. Sweet Vernal Negative    11. Timothy Negative    12. Cocklebur Negative    13. Burweed Marshelder Negative    14. Ragweed, short Negative    15. Ragweed, Giant Negative    16. Plantain,  English Negative    17. Lamb's Quarters Negative    18. Sheep Sorrell Negative    19. Rough Pigweed Negative    20. Marsh Elder, Rough  Negative    21. Mugwort, Common Negative    22. Ash mix Negative    23. Birch mix Negative    24. Beech American Negative    25. Box, Elder Negative    26. Cedar, red Negative    27. Cottonwood, Eastern 2+    28. Elm mix Negative    29. Hickory Negative    30. Maple mix 2+    31. Oak, [place] mix Negative    32. Pecan Pollen Negative    33. Pine mix Negative    34. Sycamore Eastern Negative    35. [place], Black Pollen Negative    36. Alternaria alternata Negative    37. Cladosporium Herbarum Negative    38. Aspergillus mix Negative    39. Penicillium mix Negative    40. Bipolaris sorokiniana (Helminthosporium) Negative    41. Drechslera spicifera (Curvularia) Negative    42. Mucor plumbeus Negative    43. Fusarium moniliforme Negative    44. Aureobasidium pullulans (pullulara) Negative    45. Rhizopus oryzae Negative    46. Botrytis cinera Negative    47. Epicoccum nigrum Negative    48. Phoma betae Negative    49. Candida Albicans Negative    50. Trichophyton mentagrophytes Negative    51. Mite, D Farinae  5,000 AU/ml Negative  52. Mite, D Pteronyssinus  5,000 AU/ml Negative    53. Cat Hair 10,000 BAU/ml Negative    54.  Dog Epithelia Negative    55. Mixed Feathers Negative    56. Horse Epithelia Negative    57. Cockroach, German Negative    58. Mouse Negative    59. Tobacco Leaf Negative             Food Perc - 04/01/21 1414       Test Information   Time Antigen Placed 1414    Allergen Manufacturer Lavella Hammock    Location Back    Number of allergen test [place]   1. Peanut Negative    2. Soybean food Negative    3. Wheat, whole Negative    4. Sesame Negative    5. Milk, cow Negative    6. Egg White, chicken Negative    7. Casein Negative    8. Shellfish mix Negative    9. Fish mix Negative    10. Cashew Negative            Intradermal  Control Negative      [place] Negative      Johnson Negative      7 Grass Negative       Ragweed mix Negative      Weed mix Negative      Mold 1 2+      Mold 2 2+      Mold 3 2+      Mold 4 2+      Cat Negative      Dog Negative      Cockroach Negative      Mite mix Negative         Allergy testing results were read and interpreted by myself, documented by clinical staff.         Salvatore Marvel, MD [facility]

## 2021-04-01 NOTE — Patient Instructions (Addendum)
1. Angioedema, subsequent encounter - Testing was positive only to trees as well as indoor and outdoor molds. - Copy of testing results provided. - Avoidance measures provided. - I am unsure if these are relevant, but we will see. - We are going to get some lab work to see if there is something more serious going on (autoimmune issues, serum tryptase, and stinging insect panel). - We will call you in 1-2 weeks with the results of the testing. - Anaphylaxis management plan provided. - EpiPen training and script provided.  - Come see is next time that this happens.    2. Chronic rhinitis - Testing today showed: trees, indoor molds, and outdoor molds. - Copy of test results provided.  - Avoidance measures provided. - Start taking: Allegra OR Claritin (alternate every 1-2 months) - You can use an extra dose of the antihistamine, if needed, for breakthrough symptoms.  - Consider nasal saline rinses 1-2 times daily to remove allergens from the nasal cavities as well as help with mucous clearance (this is especially helpful to do before the nasal sprays are given)  3. Return in about 3 months (around 06/30/2021).    Please inform us of any Emergency Department visits, hospitalizations, or changes in symptoms. Call us before going to the ED for breathing or allergy symptoms since we might be able to fit you in for a sick visit. Feel free to contact us anytime with any questions, problems, or concerns.  It was a pleasure to meet you today!  Websites that have reliable patient information: 1. American Academy of Asthma, Allergy, and Immunology: www.aaaai.org 2. Food Allergy Research and Education (FARE):  foodallergy.org 3. Mothers of Asthmatics: http://www.asthmacommunitynetwork.org 4. American College of Allergy, Asthma, and Immunology: www.acaai.org   COVID-19 Vaccine Information can be found at: [email or web address].[email or web address].uk For questions related to vaccine distribution or appointments, please email [email or web address] or call [phone].   We realize that you might be concerned about having an allergic reaction to the COVID19 vaccines. To help with that concern, WE ARE OFFERING THE COVID19 VACCINES IN OUR OFFICE! Ask the front desk for dates!     Like [place] on [employer] and Instagram for our latest updates!      A healthy democracy works best when [employer] participate! Make sure you are registered to vote! If you have moved or changed any of your contact information, you will need to get this updated before voting!  In some cases, you MAY be able to register to vote online: [email or web address]       Airborne Adult Perc - 04/01/21 1414     Time Antigen Placed 1414    Allergen Manufacturer Lavella Hammock    Location Back    Number of Test 59    Panel 1 Select    1. Control-Buffer 50% Glycerol Negative    2. Control-Histamine 1 mg/ml 2+    3. Albumin saline Negative    4. [place] Negative    5. [place] Negative    6. Johnson Negative    7. [place] Blue Negative    8. Meadow Fescue Negative    9. Perennial Rye Negative    10. Sweet Vernal Negative    11. Timothy Negative    12. Cocklebur Negative    13. Burweed Marshelder Negative    14. Ragweed, short Negative    15. Ragweed, Giant Negative    16. Plantain,  English Negative    17. Lamb's Quarters Negative    18. Sheep Sorrell Negative  19. Rough Pigweed Negative    20. Marsh Elder, Rough Negative    21. Mugwort, Common Negative    22. Ash mix Negative    23. Birch mix Negative    24. Beech American Negative    25. Box, Elder Negative     26. Cedar, red Negative    27. Cottonwood, Eastern 2+    28. Elm mix Negative    29. Hickory Negative    30. Maple mix 2+    31. Oak, [place] mix Negative    32. Pecan Pollen Negative    33. Pine mix Negative    34. Sycamore Eastern Negative    35. [place], Black Pollen Negative    36. Alternaria alternata Negative    37. Cladosporium Herbarum Negative    38. Aspergillus mix Negative    39. Penicillium mix Negative    40. Bipolaris sorokiniana (Helminthosporium) Negative    41. Drechslera spicifera (Curvularia) Negative    42. Mucor plumbeus Negative    43. Fusarium moniliforme Negative    44. Aureobasidium pullulans (pullulara) Negative    45. Rhizopus oryzae Negative    46. Botrytis cinera Negative    47. Epicoccum nigrum Negative    48. Phoma betae Negative    49. Candida Albicans Negative    50. Trichophyton mentagrophytes Negative    51. Mite, D Farinae  5,000 AU/ml Negative    52. Mite, D Pteronyssinus  5,000 AU/ml Negative    53. Cat Hair 10,000 BAU/ml Negative    54.  Dog Epithelia Negative    55. Mixed Feathers Negative    56. Horse Epithelia Negative    57. Cockroach, German Negative    58. Mouse Negative    59. Tobacco Leaf Negative             Food Perc - 04/01/21 1414       Test Information   Time Antigen Placed 1414    Allergen Manufacturer Lavella Hammock    Location Back    Number of allergen test [employer]   1. Peanut Negative    2. Soybean food Negative    3. Wheat, whole Negative    4. Sesame Negative    5. Milk, cow Negative    6. Egg White, chicken Negative    7. Casein Negative    8. Shellfish mix Negative    9. Fish mix Negative    10. Cashew Negative             Intradermal - 04/01/21 1433     Time Antigen Placed 1430    Location Arm    Number of Test 14             Reducing Pollen Exposure  The American Academy of Allergy, Asthma and Immunology suggests the following steps to reduce your exposure to  pollen during allergy seasons.    Do not hang sheets or clothing out to dry; pollen may collect on these items. Do not mow lawns or spend time around freshly cut grass; mowing stirs up pollen. Keep windows closed at night.  Keep car windows closed while driving. Minimize morning activities outdoors, a time when pollen counts are usually at their highest. Stay indoors as much as possible when pollen counts or humidity is high and on windy days when pollen tends to remain in the air longer. Use air conditioning when possible.  Many air conditioners have filters that trap the  pollen spores. Use a HEPA room air filter to remove pollen form the indoor air you breathe.  Control of Mold Allergen   Mold and fungi can grow on a variety of surfaces provided certain temperature and moisture conditions exist.  Outdoor molds grow on plants, decaying vegetation and soil.  The major outdoor mold, Alternaria and Cladosporium, are found in very high numbers during hot and dry conditions.  Generally, a late Summer - Fall peak is seen for common outdoor fungal spores.  Rain will temporarily lower outdoor mold spore count, but counts rise rapidly when the rainy period ends.  The most important indoor molds are Aspergillus and Penicillium.  Dark, humid and poorly ventilated basements are ideal sites for mold growth.  The next most common sites of mold growth are the bathroom and the kitchen.  Outdoor (Seasonal) Mold Control  Positive outdoor molds via skin testing: Alternaria, Cladosporium, Bipolaris (Helminthsporium), Drechslera (Curvalaria), and Mucor  Use air conditioning and keep windows closed Avoid exposure to decaying vegetation. Avoid leaf raking. Avoid grain handling. Consider wearing a face mask if working in moldy areas.    Indoor (Perennial) Mold Control   Positive indoor molds via skin testing: Aspergillus, Penicillium, Fusarium, Aureobasidium (Pullulara), and Rhizopus  Maintain humidity below  50%. Clean washable surfaces with 5% bleach solution. Remove sources e.g. contaminated carpets.

## 2021-04-02 ENCOUNTER — Encounter: Payer: Self-pay | Admitting: Allergy & Immunology

## 2021-04-02 DIAGNOSIS — T783XXA Angioneurotic edema, initial encounter: Secondary | ICD-10-CM | POA: Insufficient documentation

## 2021-04-02 DIAGNOSIS — Z8709 Personal history of other diseases of the respiratory system: Secondary | ICD-10-CM | POA: Insufficient documentation

## 2021-04-02 DIAGNOSIS — J302 Other seasonal allergic rhinitis: Secondary | ICD-10-CM | POA: Insufficient documentation

## 2021-04-03 LAB — SEDIMENTATION RATE: Sed Rate: 10 mm/hr (ref 0–32)

## 2021-04-03 LAB — ALPHA-GAL PANEL
Allergen Lamb IgE: <0.1 kU/L
Beef IgE: <0.1 kU/L
IgE (Immunoglobulin E), Serum: 27 IU/mL (ref 6–495)
O215-IgE Alpha-Gal: <0.1 kU/L
Pork IgE: <0.1 kU/L

## 2021-04-03 LAB — ALLERGEN STINGING INSECT PANEL
Honeybee IgE: <0.1 kU/L
Hornet, White Face, IgE: <0.1 kU/L
Hornet, Yellow, IgE: <0.1 kU/L
Paper Wasp IgE: <0.1 kU/L
Yellow Jacket, IgE: <0.1 kU/L

## 2021-04-03 LAB — C-REACTIVE PROTEIN: CRP: 6 mg/L (ref 0–10)

## 2021-04-03 LAB — TRYPTASE: Tryptase: 4.7 ug/L (ref 2.2–13.2)

## 2021-05-29 ENCOUNTER — Other Ambulatory Visit: Payer: Self-pay

## 2021-05-29 ENCOUNTER — Encounter (HOSPITAL_BASED_OUTPATIENT_CLINIC_OR_DEPARTMENT_OTHER): Payer: Self-pay

## 2021-05-29 ENCOUNTER — Emergency Department (HOSPITAL_BASED_OUTPATIENT_CLINIC_OR_DEPARTMENT_OTHER)
Admission: EM | Admit: 2021-05-29 | Discharge: 2021-05-29 | Disposition: A | Discharge status: Home / Self Care | Payer: BC Managed Care – PPO | Attending: Emergency Medicine | Admitting: Emergency Medicine

## 2021-05-29 DIAGNOSIS — M545 Low back pain, unspecified: Secondary | ICD-10-CM | POA: Diagnosis present

## 2021-05-29 DIAGNOSIS — M25551 Pain in right hip: Secondary | ICD-10-CM | POA: Diagnosis not present

## 2021-05-29 DIAGNOSIS — Z7984 Long term (current) use of oral hypoglycemic drugs: Secondary | ICD-10-CM | POA: Insufficient documentation

## 2021-05-29 DIAGNOSIS — Z79899 Other long term (current) drug therapy: Secondary | ICD-10-CM | POA: Insufficient documentation

## 2021-05-29 DIAGNOSIS — M62830 Muscle spasm of back: Secondary | ICD-10-CM

## 2021-05-29 DIAGNOSIS — M62838 Other muscle spasm: Secondary | ICD-10-CM | POA: Insufficient documentation

## 2021-05-29 MED ORDER — CYCLOBENZAPRINE HCL 10 MG PO TABS
10.0000 mg | ORAL_TABLET | Freq: Once | ORAL | Status: AC
  Administered 2021-05-29: 10 mg via ORAL
  Filled 2021-05-29: qty 1

## 2021-05-29 MED ORDER — KETOROLAC TROMETHAMINE 60 MG/2ML IM SOLN
60.0000 mg | Freq: Once | INTRAMUSCULAR | Status: AC
  Administered 2021-05-29: 60 mg via INTRAMUSCULAR
  Filled 2021-05-29: qty 2

## 2021-05-29 MED ORDER — CYCLOBENZAPRINE HCL 10 MG PO TABS: 10.0000 mg | ORAL_TABLET | Freq: Two times a day (BID) | ORAL | 0 refills | Status: AC | PRN
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
## 2021-05-29 NOTE — Discharge Instructions (Addendum)
Most likely you just tweaked your back and the muscle is spasmed and tense.  Make sure you use a heating pad tonight and you were given an additional prescription for muscle relaxer if you cannot find any at home.  Continue with follow-up at the MRI tomorrow.  Avoid any twisting or bending.  If the symptoms do not improve in your back you can follow-up with [facility]. ?
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
## 2021-07-01 ENCOUNTER — Ambulatory Visit: Payer: BC Managed Care – PPO | Admitting: Allergy & Immunology

## 2021-08-07 ENCOUNTER — Ambulatory Visit: Payer: BC Managed Care – PPO | Admitting: Allergy & Immunology

## 2021-08-28 ENCOUNTER — Ambulatory Visit: Payer: BC Managed Care – PPO | Admitting: Allergy & Immunology

## 2021-09-23 ENCOUNTER — Ambulatory Visit: Payer: BC Managed Care – PPO | Admitting: Allergy & Immunology

## 2022-02-02 IMAGING — MR MR CERVICAL SPINE W/O CM
5 series · 35 of 48 positions shown · non-contrast
Comparison: None.

CLINICAL DATA: Cervical radiculopathy N2X.P5 ([id number]-CM)

EXAM:
MRI CERVICAL SPINE WITHOUT CONTRAST
TECHNIQUE: Multiplanar, multisequence MR imaging of the cervical spine was
performed. No intravenous contrast was administered.

[Series 2: T2 · sagittal · 3.0mm · 0.41mm/px · 8 of 15 slices shown (1 of 2)]
[im 1/15]
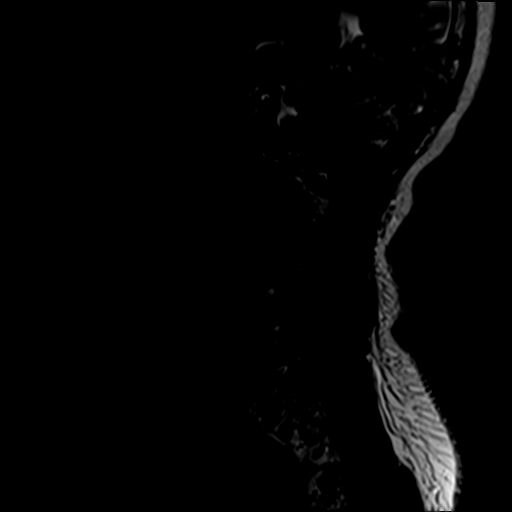
[im 3/15]
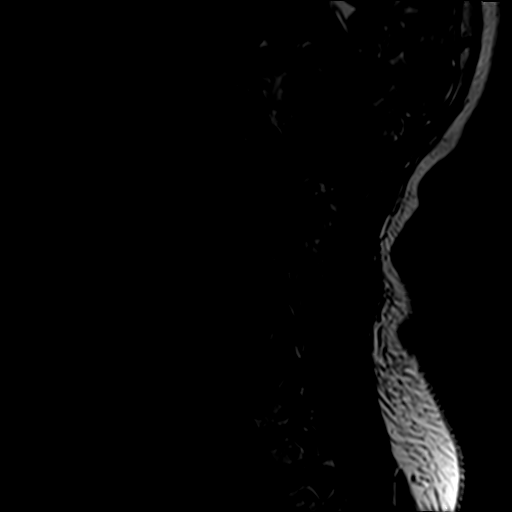
[im 5/15]
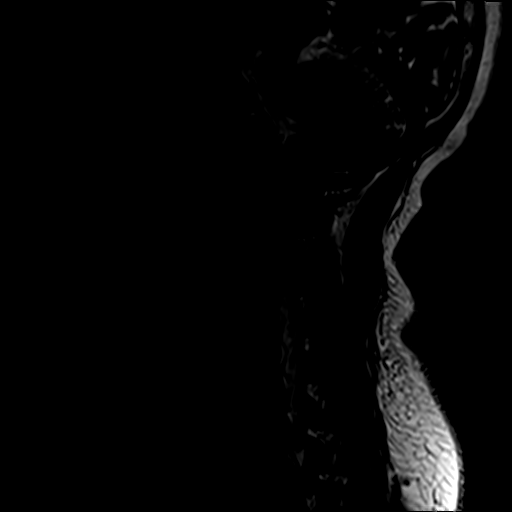
[im 7/15]
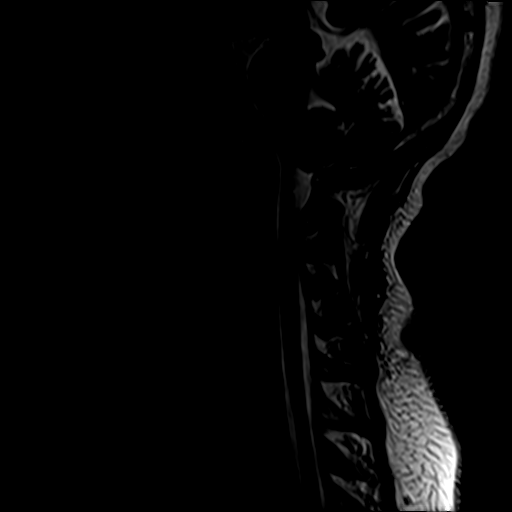
[im 9/15]
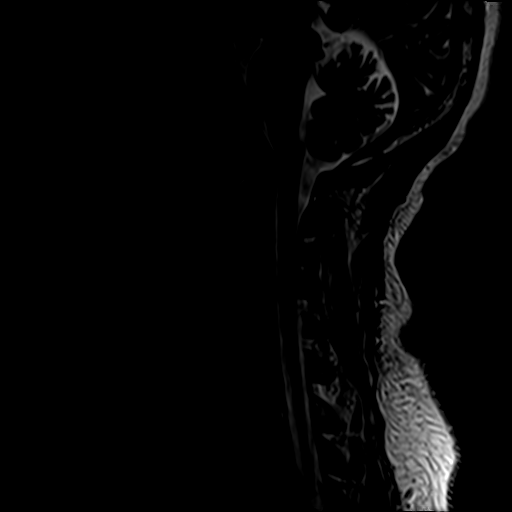
[im 11/15]
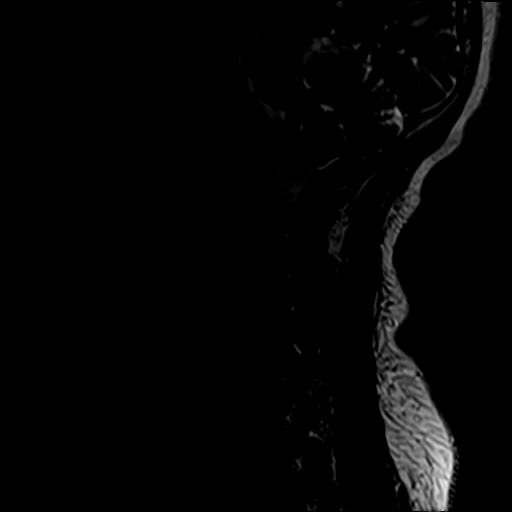
[im 13/15]
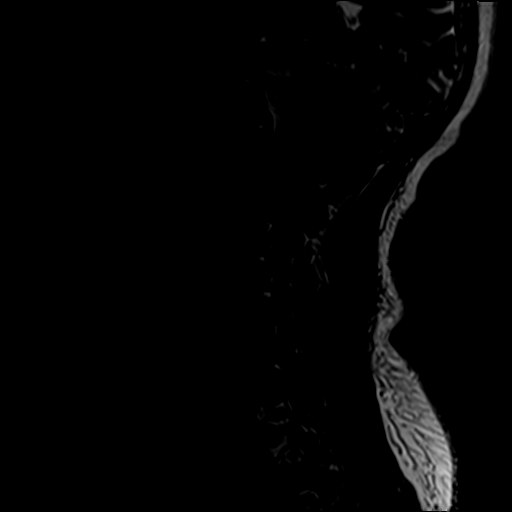
[im 15/15]
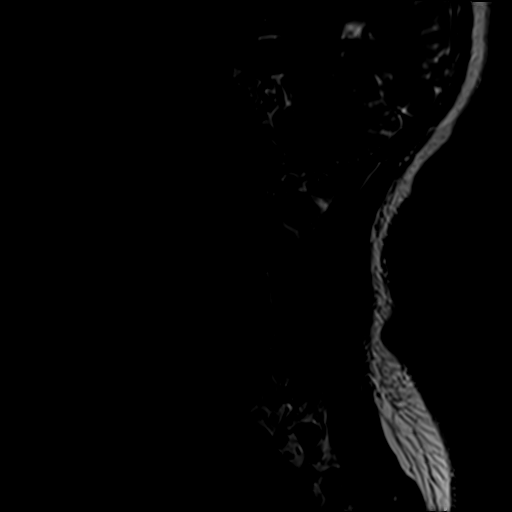

[Series 3: STIR · sagittal · 3.0mm · 0.82mm/px · 7 of 15 slices shown]
[im 1/15]
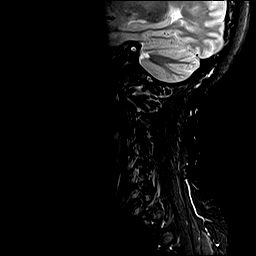
[im 3/15]
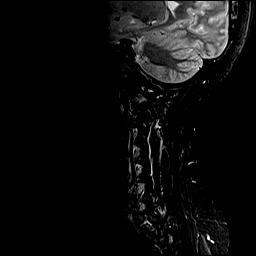
[im 5/15]
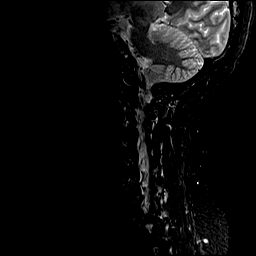
[im 8/15]
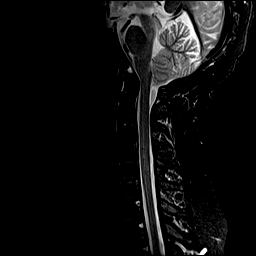
[im 10/15]
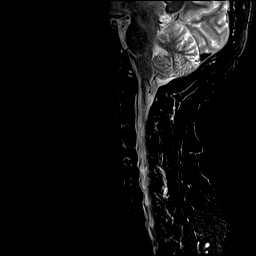
[im 12/15]
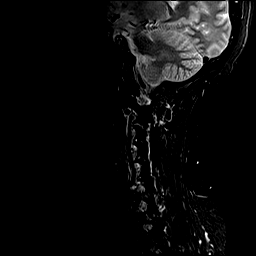
[im 15/15]
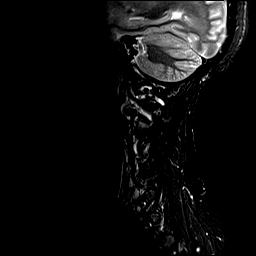

[Series 4: T1 · sagittal · 3.0mm · 0.82mm/px · 7 of 15 slices shown]
[im 1/15]
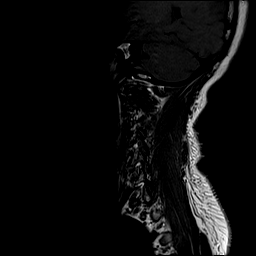
[im 3/15]
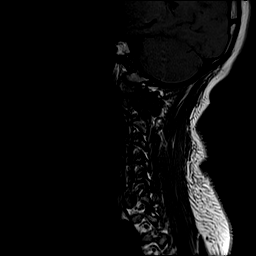
[im 5/15]
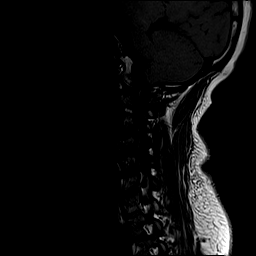
[im 8/15]
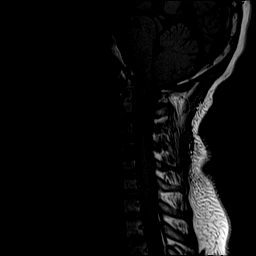
[im 10/15]
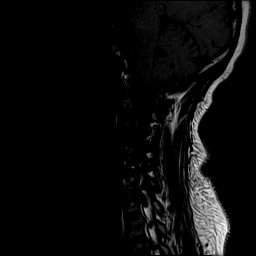
[im 12/15]
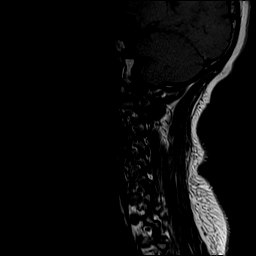
[im 15/15]
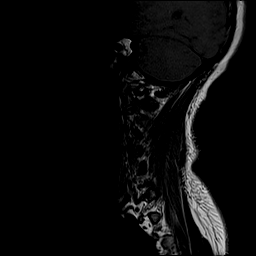

[Series 5: T2 · axial · 3.0mm · 0.70mm/px · z∈[-47,+45]mm · 9 of 26 slices shown (2 of 2)]
[im 1/26]
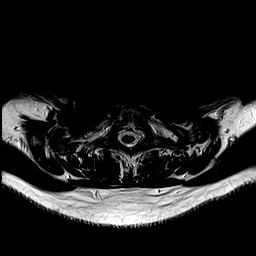
[im 5/26]
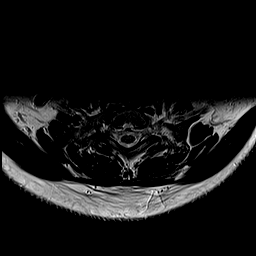
[im 9/26]
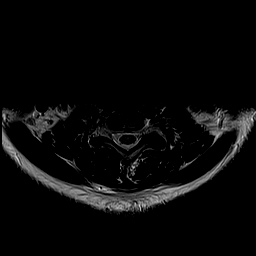
[im 11/26]
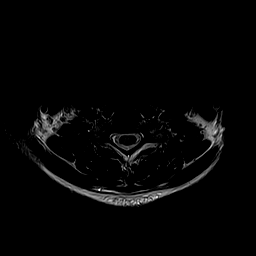
[im 13/26]
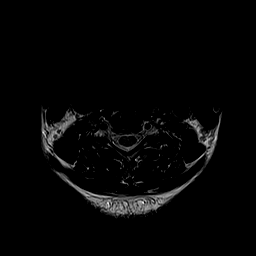
[im 15/26]
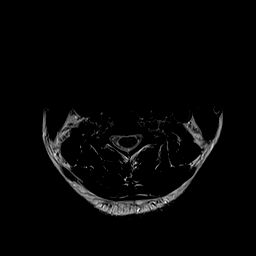
[im 17/26]
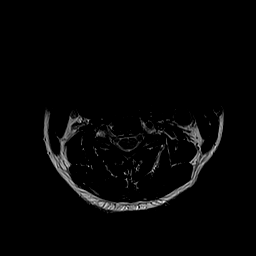
[im 21/26]
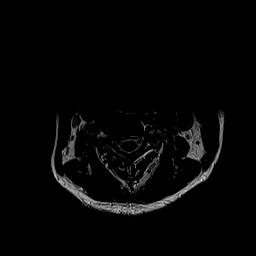
[im 26/26]
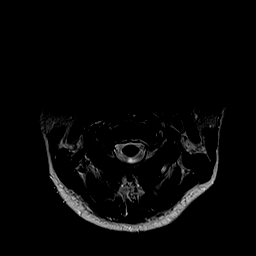

[Series 6: GRE · axial · 3.0mm · 0.35mm/px · z∈[-47,-10]mm · 4 of 26 slices shown]
[im 1/26]
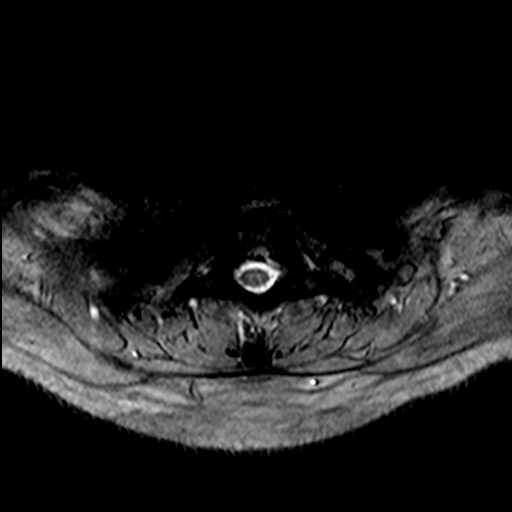
[im 5/26]
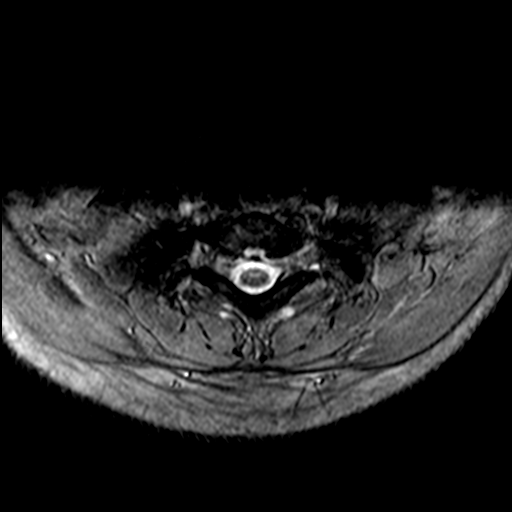
[im 9/26]
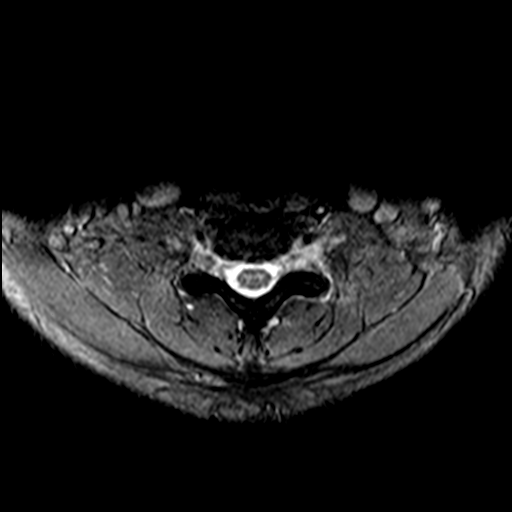
[im 11/26]
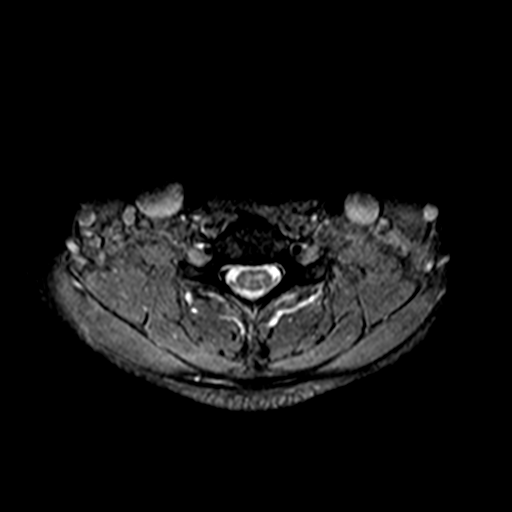

[35 of 48 positions shown; findings below may reference images not displayed]

FINDINGS: Alignment: Straightening of the cervical curvature.

Vertebrae: No fracture, evidence of discitis, or bone lesion.

Cord: Normal signal and morphology.

Posterior Fossa, vertebral arteries, paraspinal tissues: Negative.

Disc levels:

C2-3: No spinal canal or neural foraminal stenosis.

C3-4:Small posterior disc osteophyte complex and mild uncovertebral
and facet degenerative changes, right greater than left without
significant spinal canal or neural foraminal stenoses.

C4-5: No spinal canal or neural foraminal stenosis.

C5-6:No spinal canal or neural foraminal stenosis.

C6-7: No spinal canal or neural foraminal stenosis.

C7-T1: No spinal canal or neural foraminal stenosis.
IMPRESSION: 1. Straightening of the cervical curvature.
2. Mild degenerative changes at C3-4.
3. No significant spinal canal or neural foraminal stenosis at any
level.

## 2023-05-02 ENCOUNTER — Emergency Department (HOSPITAL_BASED_OUTPATIENT_CLINIC_OR_DEPARTMENT_OTHER): Payer: 59 | Admitting: Radiology

## 2023-05-02 ENCOUNTER — Emergency Department (HOSPITAL_BASED_OUTPATIENT_CLINIC_OR_DEPARTMENT_OTHER)
Admission: EM | Admit: 2023-05-02 | Discharge: 2023-05-02 | Disposition: A | Discharge status: Home / Self Care | Payer: 59 | Attending: Emergency Medicine | Admitting: Emergency Medicine

## 2023-05-02 ENCOUNTER — Encounter (HOSPITAL_BASED_OUTPATIENT_CLINIC_OR_DEPARTMENT_OTHER): Payer: Self-pay

## 2023-05-02 DIAGNOSIS — Z7984 Long term (current) use of oral hypoglycemic drugs: Secondary | ICD-10-CM | POA: Insufficient documentation

## 2023-05-02 DIAGNOSIS — E119 Type 2 diabetes mellitus without complications: Secondary | ICD-10-CM | POA: Insufficient documentation

## 2023-05-02 DIAGNOSIS — Z20822 Contact with and (suspected) exposure to covid-19: Secondary | ICD-10-CM | POA: Diagnosis not present

## 2023-05-02 DIAGNOSIS — R519 Headache, unspecified: Secondary | ICD-10-CM | POA: Diagnosis present

## 2023-05-02 DIAGNOSIS — J209 Acute bronchitis, unspecified: Secondary | ICD-10-CM | POA: Diagnosis not present

## 2023-05-02 DIAGNOSIS — J45909 Unspecified asthma, uncomplicated: Secondary | ICD-10-CM | POA: Insufficient documentation

## 2023-05-02 LAB — CBC
HCT: 37 % (ref 36.0–46.0)
Hemoglobin: 12.1 g/dL (ref 12.0–15.0)
MCH: 28.8 pg (ref 26.0–34.0)
MCHC: 32.7 g/dL (ref 30.0–36.0)
MCV: 88.1 fL (ref 80.0–100.0)
Platelets: 245 10*3/uL (ref 150–400)
RBC: 4.2 MIL/uL (ref 3.87–5.11)
RDW: 14 % (ref 11.5–15.5)
WBC: 12.2 10*3/uL — ABNORMAL HIGH (ref 4.0–10.5)
nRBC: 0 % (ref 0.0–0.2)

## 2023-05-02 LAB — BASIC METABOLIC PANEL
Anion gap: 9 (ref 5–15)
BUN: 8 mg/dL (ref 6–20)
CO2: 24 mmol/L (ref 22–32)
Calcium: 8.6 mg/dL — ABNORMAL LOW (ref 8.9–10.3)
Chloride: 106 mmol/L (ref 98–111)
Creatinine, Ser: 0.6 mg/dL (ref 0.44–1.00)
GFR, Estimated: >60 mL/min (ref >60)
Glucose, Bld: 118 mg/dL — ABNORMAL HIGH (ref 70–99)
Potassium: 3.9 mmol/L (ref 3.5–5.1)
Sodium: 139 mmol/L (ref 135–145)

## 2023-05-02 LAB — RESP PANEL BY RT-PCR (RSV, FLU A&B, COVID)  RVPGX2
Influenza A by PCR: NEGATIVE
Influenza B by PCR: NEGATIVE
Resp Syncytial Virus by PCR: NEGATIVE
SARS Coronavirus 2 by RT PCR: NEGATIVE

## 2023-05-02 LAB — TROPONIN I (HIGH SENSITIVITY)
Troponin I (High Sensitivity): <2 ng/L (ref <18)
Troponin I (High Sensitivity): <2 ng/L (ref <18)

## 2023-05-02 LAB — PREGNANCY, URINE: Preg Test, Ur: NEGATIVE

## 2023-05-02 MED ORDER — PREDNISONE 20 MG PO TABS: ORAL_TABLET | ORAL | 0 refills | Status: AC

## 2023-05-02 MED ORDER — AZITHROMYCIN 250 MG PO TABS: ORAL_TABLET | ORAL | 0 refills | Status: AC

## 2023-05-02 MED ORDER — ACETAMINOPHEN 500 MG PO TABS
1000.0000 mg | ORAL_TABLET | Freq: Once | ORAL | Status: AC
  Administered 2023-05-02: 1000 mg via ORAL
  Filled 2023-05-02: qty 2

## 2023-05-02 MED ORDER — ACETAMINOPHEN 500 MG PO TABS: 500.0000 mg | ORAL_TABLET | Freq: Four times a day (QID) | ORAL | 0 refills | Status: AC | PRN

## 2023-05-02 MED ORDER — BENZONATATE 100 MG PO CAPS: 100.0000 mg | ORAL_CAPSULE | Freq: Three times a day (TID) | ORAL | 0 refills | Status: AC

## 2023-05-02 MED ORDER — PREDNISONE 50 MG PO TABS
60.0000 mg | ORAL_TABLET | Freq: Once | ORAL | Status: AC
  Administered 2023-05-02: 60 mg via ORAL
  Filled 2023-05-02: qty 1

## 2023-05-02 MED ORDER — ALBUTEROL SULFATE HFA 108 (90 BASE) MCG/ACT IN AERS
2.0000 | INHALATION_SPRAY | RESPIRATORY_TRACT | Status: DC | PRN
  Administered 2023-05-02: 2 via RESPIRATORY_TRACT
  Filled 2023-05-02: qty 6.7

## 2023-05-02 NOTE — ED Provider Notes (Cosign Needed)
[facility] AT [facility] Provider Note   CSN: [id number] Arrival date & time: 05/02/23  1313     History  Chief Complaint  Patient presents with   URI   Chest Pain    Danielle Chang is a 53 y.o. female.  The history is provided by the patient and medical records. No language interpreter was used.  URI Chest Pain    53 year old female history of diabetes, asthma, arthritis, hyperlipidemia, presenting with cold symptoms.  Patient report progressive worsening cold symptoms ongoing for the past 2 weeks.  Initially started with a headache followed by sinus congestion, then cough, occasional wheezes, and generalized fatigue.  She endorsed pain in her chest with a cough and occasionally feels lightheadedness.  She tries over-the-counter medication at home without resolution of her symptoms.  She does not have a rescue inhaler as her asthma is overall well-controlled.  No prior history of PE DVT.  No cardiac history.  Home Medications Prior to Admission medications   Medication Sig Start Date End Date Taking? Authorizing Provider  Continuous Blood Gluc Sensor (FREESTYLE LIBRE 2 SENSOR) MISC CHANGE SENSOR EVERY 14 DAYS 10/25/20   [provider]  cyclobenzaprine (FLEXERIL) 10 MG tablet Take 1 tablet (10 mg total) by mouth 2 (two) times daily as needed for muscle spasms. 05/29/21   Gwyneth Sprout, MD  etodolac (LODINE) 400 MG tablet Take 400 mg by mouth 2 (two) times daily as needed for moderate pain.  04/30/17   [provider]  FARXIGA 10 MG TABS tablet Take 10 mg by mouth every morning. 01/10/21   [provider]  hydrOXYzine (VISTARIL) 25 MG capsule SMARTSIG:1-2 Capsule(s) By Mouth Every 6-8 Hours PRN 10/25/20   [provider]  ibuprofen (ADVIL,MOTRIN) 200 MG tablet Take 400 mg by mouth every 6 (six) hours as needed for headache or moderate pain.    [provider]  metFORMIN (GLUCOPHAGE) 500 MG tablet Take 500 mg  by mouth daily.     [provider]  methimazole (TAPAZOLE) 10 MG tablet Take 10 mg by mouth 2 (two) times daily. 02/11/21   [provider]  ONETOUCH ULTRA test strip CHECK GLUCOSE 3 TIMES A DAY 03/10/21   [provider]  RYBELSUS 14 MG TABS Take by mouth. 12/19/20   [provider]  TRULICITY 1.5 MG/0.5ML SOPN Inject into the skin. 03/16/21   [provider]  Vitamin D, Ergocalciferol, (DRISDOL) 1.25 MG (50000 UNIT) CAPS capsule Take 50,000 Units by mouth once a week. 11/22/20   [provider]      Allergies    Levofloxacin    Review of Systems   Review of Systems  Cardiovascular:  Positive for chest pain.  All other systems reviewed and are negative.   Physical Exam Updated Vital Signs BP 128/84 (BP Location: Right Arm)   Pulse (!) 115   Temp (!) 100.9 F (38.3 C) (Oral)   Resp 14   LMP  (LMP Unknown)   SpO2 100%  Physical Exam Vitals and nursing note reviewed.  Constitutional:      General: She is not in acute distress.    Appearance: She is well-developed.  HENT:     Head: Atraumatic.     Mouth/Throat:     Mouth: Mucous membranes are moist.  Eyes:     Conjunctiva/sclera: Conjunctivae normal.  Cardiovascular:     Rate and Rhythm: Tachycardia present.     Pulses: Normal pulses.     Heart  sounds: Normal heart sounds.  Pulmonary:     Effort: Pulmonary effort is normal.     Breath sounds: No wheezing, rhonchi or rales.  Abdominal:     Palpations: Abdomen is soft.     Tenderness: There is no abdominal tenderness.  Musculoskeletal:     Cervical back: Normal range of motion and neck supple. No rigidity or tenderness.     Right lower leg: No edema.     Left lower leg: No edema.  Skin:    Findings: No rash.  Neurological:     Mental Status: She is alert.  Psychiatric:        Mood and Affect: Mood normal.     ED Results / Procedures / Treatments   Labs (all labs ordered are listed, but only abnormal results  are displayed) Labs Reviewed  BASIC METABOLIC PANEL - Abnormal; Notable for the following components:      Result Value   Glucose, Bld 118 (*)    Calcium 8.6 (*)    All other components within normal limits  CBC - Abnormal; Notable for the following components:   WBC 12.2 (*)    All other components within normal limits  RESP PANEL BY RT-PCR (RSV, FLU A&B, COVID)  RVPGX2  PREGNANCY, URINE  TROPONIN I (HIGH SENSITIVITY)  TROPONIN I (HIGH SENSITIVITY)    EKG None  Radiology DG Chest 2 View Result Date: 05/02/2023 CLINICAL DATA:  Chest pain EXAM: CHEST - 2 VIEW COMPARISON:  05/26/2017 FINDINGS: Normal heart size. There is central airway thickening with peribronchial cuffing noted bilaterally. Streaky lung opacities are identified within bilateral perihilar regions of the upper and lower lobes. No consolidative change. No signs of pneumothorax. Visualized osseous structures appear grossly intact. IMPRESSION: Central airway thickening with peribronchial cuffing and streaky lung opacities compatible with inflammatory/infectious bronchitis. Electronically Signed   By: Signa Kell M.D.   On: 05/02/2023 14:13    Procedures Procedures    Medications Ordered in ED Medications  albuterol (VENTOLIN HFA) 108 (90 Base) MCG/ACT inhaler 2 puff (2 puffs Inhalation Given 05/02/23 1524)  acetaminophen (TYLENOL) tablet 1,000 mg (1,000 mg Oral Given 05/02/23 1508)  predniSONE (DELTASONE) tablet 60 mg (60 mg Oral Given 05/02/23 1507)    ED Course/ Medical Decision Making/ A&P                                 Medical Decision Making Amount and/or Complexity of Data Reviewed Labs: ordered. Radiology: ordered.   BP 124/74   Pulse (!) 114   Temp (!) 100.9 F (38.3 C) (Oral)   Resp 19   LMP  (LMP Unknown)   SpO2 99%   69:70 PM  53 year old female history of diabetes, asthma, arthritis, hyperlipidemia, presenting with cold symptoms.  Patient report progressive worsening cold symptoms ongoing for  the past 2 weeks.  Initially started with a headache followed by sinus congestion, then cough, occasional wheezes, and generalized fatigue.  She endorsed pain in her chest with a cough and occasionally feels lightheadedness.  She tries over-the-counter medication at home without resolution of her symptoms.  She does not have a rescue inhaler as her asthma is overall well-controlled.  No prior history of PE DVT.  No cardiac history.  Exam overall reassuring, lungs otherwise clear, heart with tachycardia, abdomen soft nontender, strength equal throughout, mentating appropriately.  Ear nose and throat exam unremarkable.  Vitals are notable for an oral temperature of  100.9, tachycardia with heart rate of 114, patient is not hypoxic and no hypotension.    -Labs ordered, independently viewed and interpreted by me.  Labs remarkable for WBC 12.2, CBG 118.  Covid/flu/rsv negative.  Normal trop -The patient was maintained on a cardiac monitor.  I personally viewed and interpreted the cardiac monitored which showed an underlying rhythm of: sinus tachycardia -Imaging independently viewed and interpreted by me and I agree with radiologist's interpretation.  Result remarkable for CXR showing bronchitis -This patient presents to the ED for concern of flu sxs, this involves an extensive number of treatment options, and is a complaint that carries with it a high risk of complications and morbidity.  The differential diagnosis includes covid, rsv, flu, pna, bronchitis, asthma exacerbation, copd, pe, acs -Co morbidities that complicate the patient evaluation includes asthma -Treatment includes prednisone, tylenol and albuterol -Reevaluation of the patient after these medicines showed that the patient improved -PCP office notes or outside notes reviewed -Escalation to admission/observation considered: patients feels much better, is comfortable with discharge, and will follow up with PCP -Prescription medication  considered, patient comfortable with prednisone, zpak, albuterol -Social Determinant of Health considered which includes hx of tobacco use         Final Clinical Impression(s) / ED Diagnoses Final diagnoses:  Acute bronchitis, unspecified organism    Rx / DC Orders ED Discharge Orders          Ordered    predniSONE (DELTASONE) 20 MG tablet        05/02/23 1524    acetaminophen (TYLENOL) 500 MG tablet  Every 6 hours PRN        05/02/23 1524    benzonatate (TESSALON) 100 MG capsule  Every 8 hours        05/02/23 1524    azithromycin (ZITHROMAX Z-PAK) 250 MG tablet        05/02/23 1524              Fayrene Helper, PA-C 05/02/23 1525

## 2023-05-02 NOTE — Discharge Instructions (Addendum)
Your symptom is consistent with acute bronchitis likely caused by a viral infection.  Please take medication prescribed and follow-up closely with your doctor for further care.  Be aware you will blood sugar may elevate while taking prednisone.  Return if you have any concern.

## 2023-05-02 NOTE — ED Triage Notes (Signed)
She c/o URI sx with persistent central chest pain, which is worse with cough. She is ambulatory and in no distress.
